# Patient Record
Sex: Female | Born: 2000 | Race: White | Hispanic: Yes | Smoking: Never smoker
Health system: Southern US, Community
[De-identification: ages and names within clinical notes are randomized; demographics above are authoritative.]

## PROBLEM LIST (undated history)

## (undated) HISTORY — PX: TYMPANOSTOMY TUBE PLACEMENT: SHX32

---
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
## 2019-09-08 ENCOUNTER — Other Ambulatory Visit: Payer: Self-pay

## 2019-09-08 DIAGNOSIS — Z20822 Contact with and (suspected) exposure to covid-19: Secondary | ICD-10-CM

## 2019-09-10 LAB — NOVEL CORONAVIRUS, NAA: SARS-CoV-2, NAA: NOT DETECTED

## 2019-09-15 ENCOUNTER — Other Ambulatory Visit: Payer: Self-pay

## 2019-09-15 DIAGNOSIS — Z20822 Contact with and (suspected) exposure to covid-19: Secondary | ICD-10-CM

## 2019-09-17 LAB — NOVEL CORONAVIRUS, NAA: SARS-CoV-2, NAA: DETECTED — AB

## 2020-01-08 ENCOUNTER — Other Ambulatory Visit: Payer: Self-pay

## 2020-01-08 ENCOUNTER — Emergency Department
Admission: EM | Admit: 2020-01-08 | Discharge: 2020-01-08 | Disposition: A | Discharge status: Home / Self Care | Payer: Self-pay | Attending: Emergency Medicine | Admitting: Emergency Medicine

## 2020-01-08 ENCOUNTER — Encounter: Payer: Self-pay | Admitting: Emergency Medicine

## 2020-01-08 DIAGNOSIS — J3489 Other specified disorders of nose and nasal sinuses: Secondary | ICD-10-CM | POA: Insufficient documentation

## 2020-01-08 DIAGNOSIS — Z5321 Procedure and treatment not carried out due to patient leaving prior to being seen by health care provider: Secondary | ICD-10-CM | POA: Insufficient documentation

## 2020-01-08 DIAGNOSIS — J029 Acute pharyngitis, unspecified: Secondary | ICD-10-CM | POA: Insufficient documentation

## 2020-01-08 DIAGNOSIS — R05 Cough: Secondary | ICD-10-CM | POA: Insufficient documentation

## 2020-01-08 NOTE — ED Triage Notes (Signed)
Pt reports cough, sore throat and runny nose for the past 2 days.

## 2020-01-10 ENCOUNTER — Other Ambulatory Visit: Payer: Self-pay | Admitting: Physician Assistant

## 2020-01-12 MED ORDER — NAPROXEN 500 MG PO TABS: 500.0000 mg | ORAL_TABLET | Freq: Two times a day (BID) | ORAL | Status: DC

## 2022-04-20 ENCOUNTER — Other Ambulatory Visit: Payer: Self-pay | Admitting: Primary Care

## 2022-04-20 DIAGNOSIS — N6310 Unspecified lump in the right breast, unspecified quadrant: Secondary | ICD-10-CM

## 2022-04-23 ENCOUNTER — Other Ambulatory Visit: Payer: Self-pay

## 2022-05-21 ENCOUNTER — Ambulatory Visit
Admission: RE | Admit: 2022-05-21 | Discharge: 2022-05-21 | Disposition: A | Discharge status: Home / Self Care | Payer: BC Managed Care – PPO | Source: Ambulatory Visit | Attending: Primary Care | Admitting: Primary Care

## 2022-05-21 DIAGNOSIS — N6310 Unspecified lump in the right breast, unspecified quadrant: Secondary | ICD-10-CM | POA: Insufficient documentation

## 2023-02-22 ENCOUNTER — Ambulatory Visit: Payer: BC Managed Care – PPO | Admitting: Family

## 2023-02-22 VITALS — BP 128/70 | HR 80 | Ht 62.0 in | Wt 237.2 lb

## 2023-02-22 DIAGNOSIS — R7303 Prediabetes: Secondary | ICD-10-CM | POA: Diagnosis not present

## 2023-02-22 DIAGNOSIS — E538 Deficiency of other specified B group vitamins: Secondary | ICD-10-CM | POA: Diagnosis not present

## 2023-02-22 DIAGNOSIS — I10 Essential (primary) hypertension: Secondary | ICD-10-CM | POA: Diagnosis not present

## 2023-02-22 DIAGNOSIS — E782 Mixed hyperlipidemia: Secondary | ICD-10-CM

## 2023-02-22 DIAGNOSIS — R5383 Other fatigue: Secondary | ICD-10-CM

## 2023-02-22 DIAGNOSIS — E66813 Obesity, class 3: Secondary | ICD-10-CM

## 2023-02-22 DIAGNOSIS — E559 Vitamin D deficiency, unspecified: Secondary | ICD-10-CM | POA: Diagnosis not present

## 2023-02-23 LAB — CMP14+EGFR
ALT: 29 IU/L (ref 0–32)
AST: 17 IU/L (ref 0–40)
Albumin/Globulin Ratio: 1.6 (ref 1.2–2.2)
Albumin: 4.1 g/dL (ref 4.0–5.0)
Alkaline Phosphatase: 75 IU/L (ref 44–121)
BUN/Creatinine Ratio: 16 (ref 9–23)
BUN: 10 mg/dL (ref 6–20)
Bilirubin Total: <0.2 mg/dL (ref 0.0–1.2)
CO2: 21 mmol/L (ref 20–29)
Calcium: 9.2 mg/dL (ref 8.7–10.2)
Chloride: 105 mmol/L (ref 96–106)
Creatinine, Ser: 0.63 mg/dL (ref 0.57–1.00)
Globulin, Total: 2.6 g/dL (ref 1.5–4.5)
Glucose: 82 mg/dL (ref 70–99)
Potassium: 4.2 mmol/L (ref 3.5–5.2)
Sodium: 140 mmol/L (ref 134–144)
Total Protein: 6.7 g/dL (ref 6.0–8.5)
eGFR: 129 mL/min/{1.73_m2} (ref >59)

## 2023-02-23 LAB — TSH: TSH: 0.013 u[IU]/mL — ABNORMAL LOW (ref 0.450–4.500)

## 2023-02-23 LAB — LIPID PANEL
Chol/HDL Ratio: 4.9 ratio — ABNORMAL HIGH (ref 0.0–4.4)
Cholesterol, Total: 161 mg/dL (ref 100–199)
HDL: 33 mg/dL — ABNORMAL LOW (ref >39)
LDL Chol Calc (NIH): 79 mg/dL (ref 0–99)
Triglycerides: 303 mg/dL — ABNORMAL HIGH (ref 0–149)
VLDL Cholesterol Cal: 49 mg/dL — ABNORMAL HIGH (ref 5–40)

## 2023-02-23 LAB — CBC WITH DIFFERENTIAL
Basophils Absolute: 0 10*3/uL (ref 0.0–0.2)
Basos: 0 %
EOS (ABSOLUTE): 0.1 10*3/uL (ref 0.0–0.4)
Eos: 1 %
Hematocrit: 38.9 % (ref 34.0–46.6)
Hemoglobin: 13.5 g/dL (ref 11.1–15.9)
Immature Grans (Abs): 0 10*3/uL (ref 0.0–0.1)
Immature Granulocytes: 0 %
Lymphocytes Absolute: 2.9 10*3/uL (ref 0.7–3.1)
Lymphs: 36 %
MCH: 30.3 pg (ref 26.6–33.0)
MCHC: 34.7 g/dL (ref 31.5–35.7)
MCV: 87 fL (ref 79–97)
Monocytes Absolute: 0.6 10*3/uL (ref 0.1–0.9)
Monocytes: 8 %
Neutrophils Absolute: 4.4 10*3/uL (ref 1.4–7.0)
Neutrophils: 55 %
RBC: 4.46 x10E6/uL (ref 3.77–5.28)
RDW: 11.9 % (ref 11.7–15.4)
WBC: 8 10*3/uL (ref 3.4–10.8)

## 2023-02-23 LAB — VITAMIN D 25 HYDROXY (VIT D DEFICIENCY, FRACTURES): Vit D, 25-Hydroxy: 23.4 ng/mL — ABNORMAL LOW (ref 30.0–100.0)

## 2023-02-23 LAB — HEMOGLOBIN A1C
Est. average glucose Bld gHb Est-mCnc: 111 mg/dL
Hgb A1c MFr Bld: 5.5 % (ref 4.8–5.6)

## 2023-02-23 LAB — VITAMIN B12: Vitamin B-12: 621 pg/mL (ref 232–1245)

## 2023-02-26 ENCOUNTER — Encounter: Payer: Self-pay | Admitting: Family

## 2023-02-26 DIAGNOSIS — R5383 Other fatigue: Secondary | ICD-10-CM | POA: Insufficient documentation

## 2023-02-26 DIAGNOSIS — E66813 Obesity, class 3: Secondary | ICD-10-CM

## 2023-02-26 DIAGNOSIS — R7303 Prediabetes: Secondary | ICD-10-CM

## 2023-02-26 HISTORY — DX: Morbid (severe) obesity due to excess calories: E66.01

## 2023-02-26 HISTORY — DX: Other fatigue: R53.83

## 2023-02-26 HISTORY — DX: Obesity, class 3: E66.813

## 2023-02-26 HISTORY — DX: Prediabetes: R73.03

## 2023-02-26 NOTE — Progress Notes (Signed)
New Patient Office Visit  Subjective    Patient ID: Julie Pierce, female    DOB: 02-02-2001  Age: 22 y.o. MRN: [id number]  CC:  Chief Complaint  Patient presents with   Establish Care    New Patient    HPI Terese Vi presents to establish care She does have additional concerns to discuss today.   She is concerned about her weight, and asks if we can restart her on the medication she was on previously.  She says that she had previously been given ozempic.   No other concerns today    Outpatient Encounter Medications as of 02/22/2023  Medication Sig   cetirizine (ZYRTEC) 10 MG tablet Take 10 mg by mouth daily.   naproxen (NAPROSYN) 500 MG tablet Take 1 tablet (500 mg total) by mouth 2 (two) times daily with a meal.   oxyCODONE-acetaminophen (ROXICET) 5-325 MG tablet Take 1 tablet by mouth every 6 (six) hours as needed.   No facility-administered encounter medications on file as of 02/22/2023.    No past medical history on file.  Past Surgical History:  Procedure Laterality Date   TYMPANOSTOMY TUBE PLACEMENT      No family history on file.  Social History   Socioeconomic History   Marital status: Significant Other    Spouse name: Not on file   Number of children: Not on file   Years of education: Not on file   Highest education level: Not on file  Occupational History   Not on file  Tobacco Use   Smoking status: Never   Smokeless tobacco: Not on file  Substance and Sexual Activity   Alcohol use: Not on file   Drug use: Not on file   Sexual activity: Not on file  Other Topics Concern   Not on file  Social History Narrative   Not on file   Social Determinants of Health   Financial Resource Strain: Not on file  Food Insecurity: Not on file  Transportation Needs: Not on file  Physical Activity: Not on file  Stress: Not on file  Social Connections: Not on file  Intimate Partner Violence: Not on file    Review of Systems  All other systems  reviewed and are negative.       Objective    BP 128/70   Pulse 80   Ht 5\' 2"  (1.575 m)   Wt 237 lb 3.2 oz (107.6 kg)   SpO2 97%   BMI 43.38 kg/m   Physical Exam Vitals and nursing note reviewed.  Constitutional:      Appearance: Normal appearance. She is obese.  HENT:     Head: Normocephalic and atraumatic.  Eyes:     Pupils: Pupils are equal, round, and reactive to light.  Cardiovascular:     Rate and Rhythm: Normal rate.  Pulmonary:     Effort: Pulmonary effort is normal.  Neurological:     General: No focal deficit present.     Mental Status: She is alert and oriented to person, place, and time. Mental status is at baseline.        Assessment & Plan:   Problem List Items Addressed This Visit   None Visit Diagnoses     B12 deficiency due to diet    -  Primary   Relevant Orders   CBC With Differential (Completed)   CMP14+EGFR (Completed)   Vitamin B12 (Completed)   Vitamin D deficiency, unspecified       Relevant Orders  VITAMIN D 25 Hydroxy (Vit-D Deficiency, Fractures) (Completed)   CBC With Differential (Completed)   CMP14+EGFR (Completed)   Prediabetes       Relevant Orders   CBC With Differential (Completed)   CMP14+EGFR (Completed)   Hemoglobin A1c (Completed)   Essential hypertension, benign       Relevant Orders   CBC With Differential (Completed)   CMP14+EGFR (Completed)   Mixed hyperlipidemia       Relevant Orders   Lipid panel (Completed)   CBC With Differential (Completed)   CMP14+EGFR (Completed)   Other fatigue       Relevant Orders   CBC With Differential (Completed)   CMP14+EGFR (Completed)   TSH (Completed)       Return in about 2 weeks (around 03/08/2023) for F/U.   Total time spent: 30 minutes  Miki Kins, FNP  02/22/2023

## 2023-03-08 ENCOUNTER — Ambulatory Visit: Payer: BC Managed Care – PPO | Admitting: Family

## 2023-03-08 ENCOUNTER — Encounter: Payer: Self-pay | Admitting: Family

## 2023-03-08 VITALS — BP 120/70 | HR 82 | Ht 62.0 in | Wt 239.4 lb

## 2023-03-08 DIAGNOSIS — R7303 Prediabetes: Secondary | ICD-10-CM

## 2023-03-08 DIAGNOSIS — R5383 Other fatigue: Secondary | ICD-10-CM | POA: Diagnosis not present

## 2023-03-08 DIAGNOSIS — R946 Abnormal results of thyroid function studies: Secondary | ICD-10-CM

## 2023-03-09 ENCOUNTER — Encounter: Payer: Self-pay | Admitting: Family

## 2023-03-09 LAB — TSH+T4F+T3FREE
Free T4: 0.6 ng/dL — ABNORMAL LOW (ref 0.82–1.77)
T3, Free: 2.2 pg/mL (ref 2.0–4.4)
TSH: 7.19 u[IU]/mL — ABNORMAL HIGH (ref 0.450–4.500)

## 2023-03-09 NOTE — Progress Notes (Signed)
Established Patient Office Visit  Subjective:  Patient ID: Julie Pierce, female    DOB: 04-07-2001  Age: 22 y.o. MRN: [id number]  Chief Complaint  Patient presents with   Follow-up    2 week follow up    Pt. Here today for 2 week n/p follow up.   Had labs done at that time, so we will review in detail today.  Labs: A1C elevated, Triglycerides high.  TSH was low, will recheck along with T3 and T4 today.   Restarting patient on Ozempic.  No other concerns at this time.   Past Medical History:  Diagnosis Date   Obesity, Class III, BMI 40-49.9 (morbid obesity) (HCC) 02/26/2023   Other fatigue 02/26/2023   Prediabetes 02/26/2023    Past Surgical History:  Procedure Laterality Date   TYMPANOSTOMY TUBE PLACEMENT  2008    Social History   Socioeconomic History   Marital status: Significant Other    Spouse name: Not on file   Number of children: Not on file   Years of education: Not on file   Highest education level: Not on file  Occupational History   Not on file  Tobacco Use   Smoking status: Never   Smokeless tobacco: Not on file  Vaping Use   Vaping Use: Some days  Substance and Sexual Activity   Alcohol use: Not Currently   Drug use: Never   Sexual activity: Yes    Birth control/protection: Implant  Other Topics Concern   Not on file  Social History Narrative   Not on file   Social Determinants of Health   Financial Resource Strain: Not on file  Food Insecurity: Not on file  Transportation Needs: Not on file  Physical Activity: Not on file  Stress: Not on file  Social Connections: Not on file  Intimate Partner Violence: Not on file    Family History  Problem Relation Age of Onset   Diabetes Mother    ADD / ADHD Father    Hypertension Father    Hyperlipidemia Father     No Known Allergies  Review of Systems  All other systems reviewed and are negative.      Objective:   BP 120/70   Pulse 82   Ht 5\' 2"  (1.575 m)   Wt 239 lb 6.4  oz (108.6 kg)   LMP 02/21/2023 (Exact Date)   SpO2 98%   BMI 43.79 kg/m   Vitals:   03/08/23 0921  BP: 120/70  Pulse: 82  Height: 5\' 2"  (1.575 m)  Weight: 239 lb 6.4 oz (108.6 kg)  SpO2: 98%  BMI (Calculated): 43.78    Physical Exam Vitals and nursing note reviewed.  Constitutional:      Appearance: Normal appearance. She is normal weight.  HENT:     Head: Normocephalic.  Eyes:     Extraocular Movements: Extraocular movements intact.     Conjunctiva/sclera: Conjunctivae normal.     Pupils: Pupils are equal, round, and reactive to light.  Cardiovascular:     Rate and Rhythm: Normal rate.  Pulmonary:     Effort: Pulmonary effort is normal.  Neurological:     General: No focal deficit present.     Mental Status: She is alert and oriented to person, place, and time.  Psychiatric:        Mood and Affect: Mood normal.        Behavior: Behavior normal.        Thought Content: Thought content normal.  Judgment: Judgment normal.      No results found for any visits on 03/08/23.  Recent Results (from the past 2160 hour(s))  Lipid panel     Status: Abnormal   Collection Time: 02/22/23 11:54 AM  Result Value Ref Range   Cholesterol, Total 161 100 - 199 mg/dL   Triglycerides 161 (H) 0 - 149 mg/dL   HDL 33 (L) >09 mg/dL   VLDL Cholesterol Cal 49 (H) 5 - 40 mg/dL   LDL Chol Calc (NIH) 79 0 - 99 mg/dL   Chol/HDL Ratio 4.9 (H) 0.0 - 4.4 ratio    Comment:                                   T. Chol/HDL Ratio                                             Men  Women                               1/2 Avg.Risk  3.4    3.3                                   Avg.Risk  5.0    4.4                                2X Avg.Risk  9.6    7.1                                3X Avg.Risk 23.4   11.0   VITAMIN D 25 Hydroxy (Vit-D Deficiency, Fractures)     Status: Abnormal   Collection Time: 02/22/23 11:54 AM  Result Value Ref Range   Vit D, 25-Hydroxy 23.4 (L) 30.0 - 100.0 ng/mL     Comment: Vitamin D deficiency has been defined by the Institute of Medicine and an Endocrine Society practice guideline as a level of serum 25-OH vitamin D less than 20 ng/mL (1,2). The Endocrine Society went on to further define vitamin D insufficiency as a level between 21 and 29 ng/mL (2). 1. IOM (Institute of Medicine). 2010. Dietary reference    intakes for calcium and D. [place]: The    [employer]. 2. Holick MF, Binkley Elliott, Bischoff-Ferrari HA, et al.    Evaluation, treatment, and prevention of vitamin D    deficiency: an Endocrine Society clinical practice    guideline. JCEM. 2011 Jul; 96(7):1911-30.   CBC With Differential     Status: None   Collection Time: 02/22/23 11:54 AM  Result Value Ref Range   WBC 8.0 3.4 - 10.8 x10E3/uL   RBC 4.46 3.77 - 5.28 x10E6/uL   Hemoglobin 13.5 11.1 - 15.9 g/dL   Hematocrit 60.4 54.0 - 46.6 %   MCV 87 79 - 97 fL   MCH 30.3 26.6 - 33.0 pg   MCHC 34.7 31.5 - 35.7 g/dL   RDW 98.1 19.1 - 47.8 %   Neutrophils 55 Not Estab. %   Lymphs 36 Not Estab. %  Monocytes 8 Not Estab. %   Eos 1 Not Estab. %   Basos 0 Not Estab. %   Neutrophils Absolute 4.4 1.4 - 7.0 x10E3/uL   Lymphocytes Absolute 2.9 0.7 - 3.1 x10E3/uL   Monocytes Absolute 0.6 0.1 - 0.9 x10E3/uL   EOS (ABSOLUTE) 0.1 0.0 - 0.4 x10E3/uL   Basophils Absolute 0.0 0.0 - 0.2 x10E3/uL   Immature Granulocytes 0 Not Estab. %   Immature Grans (Abs) 0.0 0.0 - 0.1 x10E3/uL  CMP14+EGFR     Status: None   Collection Time: 02/22/23 11:54 AM  Result Value Ref Range   Glucose 82 70 - 99 mg/dL   BUN 10 6 - 20 mg/dL   Creatinine, Ser 1.61 0.57 - 1.00 mg/dL   eGFR 096 >04 VW/UJW/1.19   BUN/Creatinine Ratio 16 9 - 23   Sodium 140 134 - 144 mmol/L   Potassium 4.2 3.5 - 5.2 mmol/L   Chloride 105 96 - 106 mmol/L   CO2 21 20 - 29 mmol/L   Calcium 9.2 8.7 - 10.2 mg/dL   Total Protein 6.7 6.0 - 8.5 g/dL   Albumin 4.1 4.0 - 5.0 g/dL   Globulin, Total 2.6 1.5 - 4.5 g/dL    Albumin/Globulin Ratio 1.6 1.2 - 2.2   Bilirubin Total <0.2 0.0 - 1.2 mg/dL   Alkaline Phosphatase 75 44 - 121 IU/L   AST 17 0 - 40 IU/L   ALT 29 0 - 32 IU/L  TSH     Status: Abnormal   Collection Time: 02/22/23 11:54 AM  Result Value Ref Range   TSH 0.013 (L) 0.450 - 4.500 uIU/mL  Hemoglobin A1c     Status: None   Collection Time: 02/22/23 11:54 AM  Result Value Ref Range   Hgb A1c MFr Bld 5.5 4.8 - 5.6 %    Comment:          Prediabetes: 5.7 - 6.4          Diabetes: >6.4          Glycemic control for adults with diabetes: <7.0    Est. average glucose Bld gHb Est-mCnc 111 mg/dL  Vitamin J47     Status: None   Collection Time: 02/22/23 11:54 AM  Result Value Ref Range   Vitamin B-12 621 232 - 1,245 pg/mL       Assessment & Plan:   Problem List Items Addressed This Visit       Active Problems   Obesity, Class III, BMI 40-49.9 (morbid obesity) (HCC)   Other fatigue   Prediabetes - Primary   Other Visit Diagnoses     Abnormal thyroid function test       Relevant Orders   TSH+T4F+T3Free       Return in about 1 month (around 04/08/2023).   Total time spent: 30 minutes  Miki Kins, FNP  03/08/2023   This document may have been prepared by [facility] Voice Recognition software and as such may include unintentional dictation errors.

## 2023-03-10 ENCOUNTER — Encounter: Payer: Self-pay | Admitting: Family

## 2023-03-19 MED ORDER — VITAMIN D (ERGOCALCIFEROL) 1.25 MG (50000 UNIT) PO CAPS: 50000.0000 [IU] | ORAL_CAPSULE | ORAL | 1 refills | Status: DC

## 2023-03-19 NOTE — Addendum Note (Signed)
Addended by: Grayling Congress on: 03/19/2023 10:44 AM   Modules accepted: Orders

## 2023-04-12 ENCOUNTER — Ambulatory Visit: Payer: BC Managed Care – PPO | Admitting: Family

## 2023-04-12 ENCOUNTER — Encounter: Payer: Self-pay | Admitting: Family

## 2023-04-12 VITALS — BP 125/69 | HR 81 | Ht 62.0 in | Wt 237.6 lb

## 2023-04-12 DIAGNOSIS — R946 Abnormal results of thyroid function studies: Secondary | ICD-10-CM

## 2023-04-12 DIAGNOSIS — R7303 Prediabetes: Secondary | ICD-10-CM | POA: Diagnosis not present

## 2023-04-13 LAB — TSH+T4F+T3FREE
Free T4: 0.87 ng/dL (ref 0.82–1.77)
T3, Free: 3.5 pg/mL (ref 2.0–4.4)
TSH: 3.24 u[IU]/mL (ref 0.450–4.500)

## 2023-04-14 ENCOUNTER — Encounter: Payer: Self-pay | Admitting: Family

## 2023-04-14 NOTE — Progress Notes (Signed)
Established Patient Office Visit  Subjective:  Patient ID: Julie Pierce, female    DOB: Sep 14, 2001  Age: 22 y.o. MRN: [id number]  Chief Complaint  Patient presents with   Follow-up    1 mo F/U    Patient is here today for her 1 month follow up.  She has been feeling well since last appointment.   She does not have additional concerns to discuss today.  She has not had any issues since restarting the Ozempic.  She does need to have her TSH rechecked today.  She needs refills.   I have reviewed her active problem list, medication list, allergies, notes from last encounter, lab results for her appointment today.   No other concerns at this time.   Past Medical History:  Diagnosis Date   Obesity, Class III, BMI 40-49.9 (morbid obesity) (HCC) 02/26/2023   Other fatigue 02/26/2023   Prediabetes 02/26/2023    Past Surgical History:  Procedure Laterality Date   TYMPANOSTOMY TUBE PLACEMENT  2008    Social History   Socioeconomic History   Marital status: Significant Other    Spouse name: Not on file   Number of children: Not on file   Years of education: Not on file   Highest education level: Not on file  Occupational History   Not on file  Tobacco Use   Smoking status: Never   Smokeless tobacco: Not on file  Vaping Use   Vaping Use: Some days  Substance and Sexual Activity   Alcohol use: Not Currently   Drug use: Never   Sexual activity: Yes    Birth control/protection: Implant  Other Topics Concern   Not on file  Social History Narrative   Not on file   Social Determinants of Health   Financial Resource Strain: Not on file  Food Insecurity: Not on file  Transportation Needs: Not on file  Physical Activity: Not on file  Stress: Not on file  Social Connections: Not on file  Intimate Partner Violence: Not on file    Family History  Problem Relation Age of Onset   Diabetes Mother    ADD / ADHD Father    Hypertension Father    Hyperlipidemia Father      No Known Allergies  Review of Systems  All other systems reviewed and are negative.      Objective:   BP 125/69   Pulse 81   Ht 5\' 2"  (1.575 m)   Wt 237 lb 9.6 oz (107.8 kg)   SpO2 99%   BMI 43.46 kg/m   Vitals:   04/12/23 1016  BP: 125/69  Pulse: 81  Height: 5\' 2"  (1.575 m)  Weight: 237 lb 9.6 oz (107.8 kg)  SpO2: 99%  BMI (Calculated): 43.45    Physical Exam Vitals and nursing note reviewed.  Constitutional:      Appearance: Normal appearance. She is normal weight.  HENT:     Head: Normocephalic.  Eyes:     Pupils: Pupils are equal, round, and reactive to light.  Cardiovascular:     Rate and Rhythm: Normal rate.  Pulmonary:     Effort: Pulmonary effort is normal.  Neurological:     Mental Status: She is alert.      Results for orders placed or performed in visit on 04/12/23  TSH+T4F+T3Free  Result Value Ref Range   TSH 3.240 0.450 - 4.500 uIU/mL   T3, Free 3.5 2.0 - 4.4 pg/mL   Free T4 0.87 0.82 - 1.77 ng/dL  Recent Results (from the past 2160 hour(s))  Lipid panel     Status: Abnormal   Collection Time: 02/22/23 11:54 AM  Result Value Ref Range   Cholesterol, Total 161 100 - 199 mg/dL   Triglycerides 161 (H) 0 - 149 mg/dL   HDL 33 (L) >09 mg/dL   VLDL Cholesterol Cal 49 (H) 5 - 40 mg/dL   LDL Chol Calc (NIH) 79 0 - 99 mg/dL   Chol/HDL Ratio 4.9 (H) 0.0 - 4.4 ratio    Comment:                                   T. Chol/HDL Ratio                                             Men  Women                               1/2 Avg.Risk  3.4    3.3                                   Avg.Risk  5.0    4.4                                2X Avg.Risk  9.6    7.1                                3X Avg.Risk 23.4   11.0   VITAMIN D 25 Hydroxy (Vit-D Deficiency, Fractures)     Status: Abnormal   Collection Time: 02/22/23 11:54 AM  Result Value Ref Range   Vit D, 25-Hydroxy 23.4 (L) 30.0 - 100.0 ng/mL    Comment: Vitamin D deficiency has been defined by  the Institute of Medicine and an Endocrine Society practice guideline as a level of serum 25-OH vitamin D less than 20 ng/mL (1,2). The Endocrine Society went on to further define vitamin D insufficiency as a level between 21 and 29 ng/mL (2). 1. IOM (Institute of Medicine). 2010. Dietary reference    intakes for calcium and D. [place]: The    [employer]. 2. Holick MF, Binkley [place], Bischoff-Ferrari HA, et al.    Evaluation, treatment, and prevention of vitamin D    deficiency: an Endocrine Society clinical practice    guideline. JCEM. 2011 Jul; 96(7):1911-30.   CBC With Differential     Status: None   Collection Time: 02/22/23 11:54 AM  Result Value Ref Range   WBC 8.0 3.4 - 10.8 x10E3/uL   RBC 4.46 3.77 - 5.28 x10E6/uL   Hemoglobin 13.5 11.1 - 15.9 g/dL   Hematocrit 60.4 54.0 - 46.6 %   MCV 87 79 - 97 fL   MCH 30.3 26.6 - 33.0 pg   MCHC 34.7 31.5 - 35.7 g/dL   RDW 98.1 19.1 - 47.8 %   Neutrophils 55 Not Estab. %   Lymphs 36 Not Estab. %   Monocytes 8 Not Estab. %   Eos 1 Not Estab. %   Basos  0 Not Estab. %   Neutrophils Absolute 4.4 1.4 - 7.0 x10E3/uL   Lymphocytes Absolute 2.9 0.7 - 3.1 x10E3/uL   Monocytes Absolute 0.6 0.1 - 0.9 x10E3/uL   EOS (ABSOLUTE) 0.1 0.0 - 0.4 x10E3/uL   Basophils Absolute 0.0 0.0 - 0.2 x10E3/uL   Immature Granulocytes 0 Not Estab. %   Immature Grans (Abs) 0.0 0.0 - 0.1 x10E3/uL  CMP14+EGFR     Status: None   Collection Time: 02/22/23 11:54 AM  Result Value Ref Range   Glucose 82 70 - 99 mg/dL   BUN 10 6 - 20 mg/dL   Creatinine, Ser 1.61 0.57 - 1.00 mg/dL   eGFR 096 >04 VW/UJW/1.19   BUN/Creatinine Ratio 16 9 - 23   Sodium 140 134 - 144 mmol/L   Potassium 4.2 3.5 - 5.2 mmol/L   Chloride 105 96 - 106 mmol/L   CO2 21 20 - 29 mmol/L   Calcium 9.2 8.7 - 10.2 mg/dL   Total Protein 6.7 6.0 - 8.5 g/dL   Albumin 4.1 4.0 - 5.0 g/dL   Globulin, Total 2.6 1.5 - 4.5 g/dL   Albumin/Globulin Ratio 1.6 1.2 - 2.2   Bilirubin Total  <0.2 0.0 - 1.2 mg/dL   Alkaline Phosphatase 75 44 - 121 IU/L   AST 17 0 - 40 IU/L   ALT 29 0 - 32 IU/L  TSH     Status: Abnormal   Collection Time: 02/22/23 11:54 AM  Result Value Ref Range   TSH 0.013 (L) 0.450 - 4.500 uIU/mL  Hemoglobin A1c     Status: None   Collection Time: 02/22/23 11:54 AM  Result Value Ref Range   Hgb A1c MFr Bld 5.5 4.8 - 5.6 %    Comment:          Prediabetes: 5.7 - 6.4          Diabetes: >6.4          Glycemic control for adults with diabetes: <7.0    Est. average glucose Bld gHb Est-mCnc 111 mg/dL  Vitamin J47     Status: None   Collection Time: 02/22/23 11:54 AM  Result Value Ref Range   Vitamin B-12 621 232 - 1,245 pg/mL  TSH+T4F+T3Free     Status: Abnormal   Collection Time: 03/08/23 10:25 AM  Result Value Ref Range   TSH 7.190 (H) 0.450 - 4.500 uIU/mL   T3, Free 2.2 2.0 - 4.4 pg/mL   Free T4 0.60 (L) 0.82 - 1.77 ng/dL  [id number]+[id number]     Status: None   Collection Time: 04/12/23 10:33 AM  Result Value Ref Range   TSH 3.240 0.450 - 4.500 uIU/mL   T3, Free 3.5 2.0 - 4.4 pg/mL   Free T4 0.87 0.82 - 1.77 ng/dL       Assessment & Plan:   Problem List Items Addressed This Visit       Active Problems   Obesity, Class III, BMI 40-49.9 (morbid obesity) (HCC)    Continue current meds.  Will adjust as needed based on results.  The patient is asked to make an attempt to improve diet and exercise patterns to aid in medical management of this problem. Addressed importance of increasing and maintaining water intake.        Prediabetes    A1C Continues to be in prediabetic ranges.  Will reassess at follow up after next lab check.  Patient counseled on dietary choices and verbalized understanding.  Patient educated on foods that contain carbohydrates  and the need to decrease intake.  We discussed prediabetes, and what it means and the need for strict dietary control to prevent progression to type 2 diabetes.  Advised to decrease intake of sugary  drinks, including sodas, sweet tea, and some juices, and of starch and sugar heavy foods (ie., potatoes, rice, bread, pasta, desserts). She verbalizes understanding and agreement with the changes discussed today.        Other Visit Diagnoses     Abnormal results of thyroid function studies    -  Primary   Rechecking TSH/T3/T4 today.  will adjust based on results.   Relevant Orders   TSH+T4F+T3Free (Completed)       Return in about 1 month (around 05/12/2023).   Total time spent: 20 minutes  Miki Kins, FNP  04/12/2023   This document may have been prepared by [facility] Voice Recognition software and as such may include unintentional dictation errors.

## 2023-04-14 NOTE — Assessment & Plan Note (Signed)
A1C Continues to be in prediabetic ranges.  Will reassess at follow up after next lab check.  Patient counseled on dietary choices and verbalized understanding.  Patient educated on foods that contain carbohydrates and the need to decrease intake.  We discussed prediabetes, and what it means and the need for strict dietary control to prevent progression to type 2 diabetes.  Advised to decrease intake of sugary drinks, including sodas, sweet tea, and some juices, and of starch and sugar heavy foods (ie., potatoes, rice, bread, pasta, desserts). She verbalizes understanding and agreement with the changes discussed today.  

## 2023-04-14 NOTE — Assessment & Plan Note (Signed)
Continue current meds.  Will adjust as needed based on results.  The patient is asked to make an attempt to improve diet and exercise patterns to aid in medical management of this problem. Addressed importance of increasing and maintaining water intake.   

## 2023-05-14 ENCOUNTER — Ambulatory Visit: Payer: BC Managed Care – PPO | Admitting: Family

## 2023-05-24 ENCOUNTER — Ambulatory Visit: Payer: BC Managed Care – PPO | Admitting: Family

## 2023-05-24 ENCOUNTER — Encounter: Payer: Self-pay | Admitting: Family

## 2023-05-24 VITALS — BP 130/89 | HR 77 | Ht 62.0 in | Wt 236.4 lb

## 2023-05-24 DIAGNOSIS — I10 Essential (primary) hypertension: Secondary | ICD-10-CM

## 2023-05-24 DIAGNOSIS — E782 Mixed hyperlipidemia: Secondary | ICD-10-CM | POA: Diagnosis not present

## 2023-05-24 DIAGNOSIS — E559 Vitamin D deficiency, unspecified: Secondary | ICD-10-CM

## 2023-05-24 DIAGNOSIS — R7303 Prediabetes: Secondary | ICD-10-CM

## 2023-05-24 DIAGNOSIS — G4452 New daily persistent headache (NDPH): Secondary | ICD-10-CM

## 2023-05-24 DIAGNOSIS — E538 Deficiency of other specified B group vitamins: Secondary | ICD-10-CM

## 2023-05-24 DIAGNOSIS — R5383 Other fatigue: Secondary | ICD-10-CM

## 2023-05-27 ENCOUNTER — Other Ambulatory Visit: Payer: Self-pay

## 2023-05-27 MED ORDER — FENOFIBRATE 145 MG PO TABS: 145.0000 mg | ORAL_TABLET | Freq: Every day | ORAL | 11 refills | Status: DC

## 2023-06-16 NOTE — Assessment & Plan Note (Signed)

## 2023-06-16 NOTE — Progress Notes (Signed)
Established Patient Office Visit  Subjective:  Patient ID: Julie Pierce, female    DOB: 01/17/2001  Age: 22 y.o. MRN: [id number]  Chief Complaint  Patient presents with   Follow-up    Rescheduled appt    Patient is here today for her 3 months follow up.  She has been feeling well since last appointment.   She does have additional concerns to discuss today.  She has been having difficulty with headaches and daytime sleepiness. She says these have been happening for a while. She has been told in the past that she snores.   Labs are due today. She needs refills.   I have reviewed her active problem list, medication list, allergies, notes from last encounter, lab results for her appointment today.   No other concerns at this time.   Past Medical History:  Diagnosis Date   Obesity, Class III, BMI 40-49.9 (morbid obesity) (HCC) 02/26/2023   Other fatigue 02/26/2023   Prediabetes 02/26/2023    Past Surgical History:  Procedure Laterality Date   TYMPANOSTOMY TUBE PLACEMENT  2008    Social History   Socioeconomic History   Marital status: Significant Other    Spouse name: Not on file   Number of children: Not on file   Years of education: Not on file   Highest education level: Not on file  Occupational History   Not on file  Tobacco Use   Smoking status: Never   Smokeless tobacco: Not on file  Vaping Use   Vaping status: Some Days  Substance and Sexual Activity   Alcohol use: Not Currently   Drug use: Never   Sexual activity: Yes    Birth control/protection: Implant  Other Topics Concern   Not on file  Social History Narrative   Not on file   Social Determinants of Health   Financial Resource Strain: Not on file  Food Insecurity: Not on file  Transportation Needs: Not on file  Physical Activity: Not on file  Stress: Not on file  Social Connections: Not on file  Intimate Partner Violence: Not on file    Family History  Problem Relation Age of Onset    Diabetes Mother    ADD / ADHD Father    Hypertension Father    Hyperlipidemia Father     No Known Allergies  Review of Systems  Constitutional:  Positive for malaise/fatigue.  All other systems reviewed and are negative.      Objective:   BP 130/89   Pulse 77   Ht 5\' 2"  (1.575 m)   Wt 236 lb 6.4 oz (107.2 kg)   SpO2 98%   BMI 43.24 kg/m   Vitals:   05/24/23 1311  BP: 130/89  Pulse: 77  Height: 5\' 2"  (1.575 m)  Weight: 236 lb 6.4 oz (107.2 kg)  SpO2: 98%  BMI (Calculated): 43.23    Physical Exam Vitals and nursing note reviewed.  Constitutional:      Appearance: Normal appearance. She is normal weight.  HENT:     Head: Normocephalic.  Eyes:     Pupils: Pupils are equal, round, and reactive to light.  Cardiovascular:     Rate and Rhythm: Normal rate.  Pulmonary:     Effort: Pulmonary effort is normal.  Neurological:     General: No focal deficit present.     Mental Status: She is alert and oriented to person, place, and time. Mental status is at baseline.  Psychiatric:  Mood and Affect: Mood normal.        Thought Content: Thought content normal.        Judgment: Judgment normal.      Results for orders placed or performed in visit on 05/24/23  Lipid panel  Result Value Ref Range   Cholesterol, Total 165 100 - 199 mg/dL   Triglycerides 528 (H) 0 - 149 mg/dL   HDL 33 (L) >41 mg/dL   VLDL Cholesterol Cal 51 (H) 5 - 40 mg/dL   LDL Chol Calc (NIH) 81 0 - 99 mg/dL   Chol/HDL Ratio 5.0 (H) 0.0 - 4.4 ratio  VITAMIN D 25 Hydroxy (Vit-D Deficiency, Fractures)  Result Value Ref Range   Vit D, 25-Hydroxy 35.7 30.0 - 100.0 ng/mL  CMP14+EGFR  Result Value Ref Range   Glucose 76 70 - 99 mg/dL   BUN 11 6 - 20 mg/dL   Creatinine, Ser 3.24 0.57 - 1.00 mg/dL   eGFR 401 >02 VO/ZDG/6.44   BUN/Creatinine Ratio 17 9 - 23   Sodium 139 134 - 144 mmol/L   Potassium 4.6 3.5 - 5.2 mmol/L   Chloride 105 96 - 106 mmol/L   CO2 19 (L) 20 - 29 mmol/L   Calcium  9.2 8.7 - 10.2 mg/dL   Total Protein 6.8 6.0 - 8.5 g/dL   Albumin 4.0 4.0 - 5.0 g/dL   Globulin, Total 2.8 1.5 - 4.5 g/dL   Bilirubin Total <0.3 0.0 - 1.2 mg/dL   Alkaline Phosphatase 75 44 - 121 IU/L   AST 15 0 - 40 IU/L   ALT 34 (H) 0 - 32 IU/L  TSH  Result Value Ref Range   TSH 1.690 0.450 - 4.500 uIU/mL  Hemoglobin A1c  Result Value Ref Range   Hgb A1c MFr Bld 5.3 4.8 - 5.6 %   Est. average glucose Bld gHb Est-mCnc 105 mg/dL  Vitamin K74  Result Value Ref Range   Vitamin B-12 569 232 - 1,245 pg/mL    Recent Results (from the past 2160 hour(s))  TSH+T4F+T3Free     Status: None   Collection Time: 04/12/23 10:33 AM  Result Value Ref Range   TSH 3.240 0.450 - 4.500 uIU/mL   T3, Free 3.5 2.0 - 4.4 pg/mL   Free T4 0.87 0.82 - 1.77 ng/dL  Lipid panel     Status: Abnormal   Collection Time: 05/24/23  1:44 PM  Result Value Ref Range   Cholesterol, Total 165 100 - 199 mg/dL   Triglycerides 259 (H) 0 - 149 mg/dL   HDL 33 (L) >56 mg/dL   VLDL Cholesterol Cal 51 (H) 5 - 40 mg/dL   LDL Chol Calc (NIH) 81 0 - 99 mg/dL   Chol/HDL Ratio 5.0 (H) 0.0 - 4.4 ratio    Comment:                                   T. Chol/HDL Ratio                                             Men  Women                               1/2 Avg.Risk  3.4  3.3                                   Avg.Risk  5.0    4.4                                2X Avg.Risk  9.6    7.1                                3X Avg.Risk 23.4   11.0   VITAMIN D 25 Hydroxy (Vit-D Deficiency, Fractures)     Status: None   Collection Time: 05/24/23  1:44 PM  Result Value Ref Range   Vit D, 25-Hydroxy 35.7 30.0 - 100.0 ng/mL    Comment: Vitamin D deficiency has been defined by the Institute of Medicine and an Endocrine Society practice guideline as a level of serum 25-OH vitamin D less than 20 ng/mL (1,2). The Endocrine Society went on to further define vitamin D insufficiency as a level between 21 and 29 ng/mL (2). 1. IOM (Institute of  Medicine). 2010. Dietary reference    intakes for calcium and D. [place]: The    [employer]. 2. Holick MF, Binkley [place], Bischoff-Ferrari HA, et al.    Evaluation, treatment, and prevention of vitamin D    deficiency: an Endocrine Society clinical practice    guideline. JCEM. 2011 Jul; 96(7):1911-30.   CMP14+EGFR     Status: Abnormal   Collection Time: 05/24/23  1:44 PM  Result Value Ref Range   Glucose 76 70 - 99 mg/dL   BUN 11 6 - 20 mg/dL   Creatinine, Ser 1.61 0.57 - 1.00 mg/dL   eGFR 096 >04 VW/UJW/1.19   BUN/Creatinine Ratio 17 9 - 23   Sodium 139 134 - 144 mmol/L   Potassium 4.6 3.5 - 5.2 mmol/L   Chloride 105 96 - 106 mmol/L   CO2 19 (L) 20 - 29 mmol/L   Calcium 9.2 8.7 - 10.2 mg/dL   Total Protein 6.8 6.0 - 8.5 g/dL   Albumin 4.0 4.0 - 5.0 g/dL   Globulin, Total 2.8 1.5 - 4.5 g/dL   Bilirubin Total <1.4 0.0 - 1.2 mg/dL   Alkaline Phosphatase 75 44 - 121 IU/L   AST 15 0 - 40 IU/L   ALT 34 (H) 0 - 32 IU/L  TSH     Status: None   Collection Time: 05/24/23  1:44 PM  Result Value Ref Range   TSH 1.690 0.450 - 4.500 uIU/mL  Hemoglobin A1c     Status: None   Collection Time: 05/24/23  1:44 PM  Result Value Ref Range   Hgb A1c MFr Bld 5.3 4.8 - 5.6 %    Comment:          Prediabetes: 5.7 - 6.4          Diabetes: >6.4          Glycemic control for adults with diabetes: <7.0    Est. average glucose Bld gHb Est-mCnc 105 mg/dL  Vitamin N82     Status: None   Collection Time: 05/24/23  1:44 PM  Result Value Ref Range   Vitamin B-12 569 232 - 1,245 pg/mL       Assessment & Plan:   Problem List Items Addressed This Visit  Active Problems   Obesity, Class III, BMI 40-49.9 (morbid obesity) (HCC)    Continue current meds.  Will adjust as needed based on results.  The patient is asked to make an attempt to improve diet and exercise patterns to aid in medical management of this problem. Addressed importance of increasing and maintaining water  intake.        Relevant Orders   CMP14+EGFR (Completed)   Other fatigue   Relevant Orders   CMP14+EGFR (Completed)   TSH (Completed)   Prediabetes - Primary    Patient educated on foods that contain carbohydrates and the need to decrease intake.  We discussed prediabetes, and what it means and the need for strict dietary control to prevent progression to type 2 diabetes.  Advised to decrease intake of sugary drinks, including sodas, sweet tea, and some juices, and of starch and sugar heavy foods (ie., potatoes, rice, bread, pasta, desserts). She verbalizes understanding and agreement with the changes discussed today.  A1C Continues to be in prediabetic ranges.  Will reassess at follow up after next lab check.  Patient counseled on dietary choices and verbalized understanding.        Relevant Orders   CMP14+EGFR (Completed)   Hemoglobin A1c (Completed)   Other Visit Diagnoses     B12 deficiency due to diet       Checking labs today.  Will continue supplements as needed.   Relevant Orders   CMP14+EGFR (Completed)   Vitamin B12 (Completed)   Vitamin D deficiency, unspecified       Checking labs today.  Will continue supplements as needed.   Relevant Orders   VITAMIN D 25 Hydroxy (Vit-D Deficiency, Fractures) (Completed)   CMP14+EGFR (Completed)   Mixed hyperlipidemia       Checking labs today.  Continue current therapy for lipid control. Will modify as needed based on labwork results.   Relevant Orders   Lipid panel (Completed)   CMP14+EGFR (Completed)   Essential hypertension, benign       Blood pressure well controlled with current medications.  Continue current therapy.  Will reassess at follow up.   Relevant Orders   CMP14+EGFR (Completed)   New daily persistent headache       Ordering sleep study for pt.  Will contact her once we get these results back.   Relevant Orders   Ambulatory referral to Sleep Studies       Return in about 3 months (around  08/24/2023).   Total time spent: 30 minutes  Miki Kins, FNP  05/24/2023   This document may have been prepared by [facility] Voice Recognition software and as such may include unintentional dictation errors.

## 2023-06-16 NOTE — Assessment & Plan Note (Signed)
Continue current meds.  Will adjust as needed based on results.  The patient is asked to make an attempt to improve diet and exercise patterns to aid in medical management of this problem. Addressed importance of increasing and maintaining water intake.   

## 2023-08-29 ENCOUNTER — Ambulatory Visit: Payer: BC Managed Care – PPO | Admitting: Family

## 2023-09-25 ENCOUNTER — Ambulatory Visit: Payer: BC Managed Care – PPO | Admitting: Family

## 2023-10-23 ENCOUNTER — Encounter: Payer: Self-pay | Admitting: Family

## 2023-10-23 ENCOUNTER — Ambulatory Visit (INDEPENDENT_AMBULATORY_CARE_PROVIDER_SITE_OTHER): Payer: BC Managed Care – PPO | Admitting: Family

## 2023-10-23 VITALS — BP 118/84 | HR 75 | Ht 62.0 in | Wt 236.8 lb

## 2023-10-23 DIAGNOSIS — E538 Deficiency of other specified B group vitamins: Secondary | ICD-10-CM | POA: Diagnosis not present

## 2023-10-23 DIAGNOSIS — E559 Vitamin D deficiency, unspecified: Secondary | ICD-10-CM

## 2023-10-23 DIAGNOSIS — I10 Essential (primary) hypertension: Secondary | ICD-10-CM

## 2023-10-23 DIAGNOSIS — R7303 Prediabetes: Secondary | ICD-10-CM

## 2023-10-23 DIAGNOSIS — R5383 Other fatigue: Secondary | ICD-10-CM

## 2023-10-23 DIAGNOSIS — Z3009 Encounter for other general counseling and advice on contraception: Secondary | ICD-10-CM

## 2023-10-23 DIAGNOSIS — E782 Mixed hyperlipidemia: Secondary | ICD-10-CM

## 2023-10-23 MED ORDER — ANNOVERA 0.013-0.15 MG/24HR VA RING: VAGINAL_RING | VAGINAL | 0 refills | Status: DC

## 2023-10-23 NOTE — Progress Notes (Signed)
 Established Patient Office Visit  Subjective:  Patient ID: Julie Pierce, female    DOB: 03-05-2001  Age: 23 y.o. MRN: [id number]  Chief Complaint  Patient presents with   Follow-up    Needs birth control    Patient is here today for her  follow up.  She has been feeling well since last appointment.   She does have additional concerns to discuss today.  Asks about options for birth control. Is almost at the end of her nexplanon, asks to decide which method she wants to use going forward.   Labs are due today. She needs refills.   I have reviewed her active problem list, medication list, allergies, health maintenance, notes from last encounter, lab results for her appointment today.      No other concerns at this time.   Past Medical History:  Diagnosis Date   Obesity, Class III, BMI 40-49.9 (morbid obesity) (HCC) 02/26/2023   Other fatigue 02/26/2023   Prediabetes 02/26/2023    Past Surgical History:  Procedure Laterality Date   TYMPANOSTOMY TUBE PLACEMENT  2008    Social History   Socioeconomic History   Marital status: Significant Other    Spouse name: Not on file   Number of children: Not on file   Years of education: Not on file   Highest education level: Not on file  Occupational History   Not on file  Tobacco Use   Smoking status: Never   Smokeless tobacco: Not on file  Vaping Use   Vaping status: Some Days  Substance and Sexual Activity   Alcohol use: Not Currently   Drug use: Never   Sexual activity: Yes    Birth control/protection: Implant  Other Topics Concern   Not on file  Social History Narrative   Not on file   Social Drivers of Health   Financial Resource Strain: Not on file  Food Insecurity: Not on file  Transportation Needs: Not on file  Physical Activity: Not on file  Stress: Not on file  Social Connections: Not on file  Intimate Partner Violence: Not on file    Family History  Problem Relation Age of Onset   Diabetes  Mother    ADD / ADHD Father    Hypertension Father    Hyperlipidemia Father     No Known Allergies  Review of Systems  All other systems reviewed and are negative.      Objective:   BP 118/84   Pulse 75   Ht 5' 2 (1.575 m)   Wt 236 lb 12.8 oz (107.4 kg)   SpO2 99%   BMI 43.31 kg/m   Vitals:   10/23/23 0936  BP: 118/84  Pulse: 75  Height: 5' 2 (1.575 m)  Weight: 236 lb 12.8 oz (107.4 kg)  SpO2: 99%  BMI (Calculated): 43.3    Physical Exam Vitals and nursing note reviewed.  Constitutional:      Appearance: Normal appearance. She is obese.  HENT:     Head: Normocephalic.  Eyes:     Extraocular Movements: Extraocular movements intact.     Conjunctiva/sclera: Conjunctivae normal.     Pupils: Pupils are equal, round, and reactive to light.  Cardiovascular:     Rate and Rhythm: Normal rate.  Pulmonary:     Effort: Pulmonary effort is normal.  Musculoskeletal:        General: Normal range of motion.  Neurological:     General: No focal deficit present.     Mental Status:  She is alert and oriented to person, place, and time. Mental status is at baseline.  Psychiatric:        Mood and Affect: Mood normal.        Behavior: Behavior normal.        Thought Content: Thought content normal.      Results for orders placed or performed in visit on 10/23/23  Lipid panel  Result Value Ref Range   Cholesterol, Total 137 100 - 199 mg/dL   Triglycerides 801 (H) 0 - 149 mg/dL   HDL 32 (L) >60 mg/dL   VLDL Cholesterol Cal 33 5 - 40 mg/dL   LDL Chol Calc (NIH) 72 0 - 99 mg/dL   Chol/HDL Ratio 4.3 0.0 - 4.4 ratio  VITAMIN D  25 Hydroxy (Vit-D Deficiency, Fractures)  Result Value Ref Range   Vit D, 25-Hydroxy 42.9 30.0 - 100.0 ng/mL  CMP14+EGFR  Result Value Ref Range   Glucose 74 70 - 99 mg/dL   BUN 12 6 - 20 mg/dL   Creatinine, Ser 9.30 0.57 - 1.00 mg/dL   eGFR 874 >40 fO/fpw/8.26   BUN/Creatinine Ratio 17 9 - 23   Sodium 141 134 - 144 mmol/L   Potassium 4.2  3.5 - 5.2 mmol/L   Chloride 103 96 - 106 mmol/L   CO2 24 20 - 29 mmol/L   Calcium 9.3 8.7 - 10.2 mg/dL   Total Protein 6.6 6.0 - 8.5 g/dL   Albumin 3.9 (L) 4.0 - 5.0 g/dL   Globulin, Total 2.7 1.5 - 4.5 g/dL   Bilirubin Total <9.7 0.0 - 1.2 mg/dL   Alkaline Phosphatase 77 44 - 121 IU/L   AST 15 0 - 40 IU/L   ALT 27 0 - 32 IU/L  TSH  Result Value Ref Range   TSH 0.256 (L) 0.450 - 4.500 uIU/mL  Hemoglobin A1c  Result Value Ref Range   Hgb A1c MFr Bld 5.5 4.8 - 5.6 %   Est. average glucose Bld gHb Est-mCnc 111 mg/dL  Vitamin A87  Result Value Ref Range   Vitamin B-12 616 232 - 1,245 pg/mL  CBC with Diff  Result Value Ref Range   WBC 8.0 3.4 - 10.8 x10E3/uL   RBC 4.30 3.77 - 5.28 x10E6/uL   Hemoglobin 13.3 11.1 - 15.9 g/dL   Hematocrit 59.9 65.9 - 46.6 %   MCV 93 79 - 97 fL   MCH 30.9 26.6 - 33.0 pg   MCHC 33.3 31.5 - 35.7 g/dL   RDW 87.4 88.2 - 84.5 %   Platelets 204 150 - 450 x10E3/uL   Neutrophils 50 Not Estab. %   Lymphs 39 Not Estab. %   Monocytes 10 Not Estab. %   Eos 1 Not Estab. %   Basos 0 Not Estab. %   Neutrophils Absolute 4.0 1.4 - 7.0 x10E3/uL   Lymphocytes Absolute 3.1 0.7 - 3.1 x10E3/uL   Monocytes Absolute 0.8 0.1 - 0.9 x10E3/uL   EOS (ABSOLUTE) 0.1 0.0 - 0.4 x10E3/uL   Basophils Absolute 0.0 0.0 - 0.2 x10E3/uL   Immature Granulocytes 0 Not Estab. %   Immature Grans (Abs) 0.0 0.0 - 0.1 x10E3/uL    Recent Results (from the past 2160 hours)  Lipid panel     Status: Abnormal   Collection Time: 10/23/23 10:25 AM  Result Value Ref Range   Cholesterol, Total 137 100 - 199 mg/dL   Triglycerides 801 (H) 0 - 149 mg/dL   HDL 32 (L) >60 mg/dL   VLDL  Cholesterol Cal 33 5 - 40 mg/dL   LDL Chol Calc (NIH) 72 0 - 99 mg/dL   Chol/HDL Ratio 4.3 0.0 - 4.4 ratio    Comment:                                   T. Chol/HDL Ratio                                             Men  Women                               1/2 Avg.Risk  3.4    3.3                                    Avg.Risk  5.0    4.4                                2X Avg.Risk  9.6    7.1                                3X Avg.Risk 23.4   11.0   VITAMIN D  25 Hydroxy (Vit-D Deficiency, Fractures)     Status: None   Collection Time: 10/23/23 10:25 AM  Result Value Ref Range   Vit D, 25-Hydroxy 42.9 30.0 - 100.0 ng/mL    Comment: Vitamin D  deficiency has been defined by the Institute of Medicine and an Endocrine Society practice guideline as a level of serum 25-OH vitamin D  less than 20 ng/mL (1,2). The Endocrine Society went on to further define vitamin D  insufficiency as a level between 21 and 29 ng/mL (2). 1. IOM (Institute of Medicine). 2010. Dietary reference    intakes for calcium and D. [place]: The    [employer]. 2. Holick MF, Bink[employer], Bischoff-Ferrari HA, et al.    Evaluation, treatment, and prevention of vitamin D     deficiency: an Endocrine Society clinical practice    guideline. JCEM. 2011 Jul; 96(7):1911-30.   CMP14+EGFR     Status: Abnormal   Collection Time: 10/23/23 10:25 AM  Result Value Ref Range   Glucose 74 70 - 99 mg/dL   BUN 12 6 - 20 mg/dL   Creatinine, Ser 9.30 0.57 - 1.00 mg/dL   eGFR 874 >40 fO/fpw/8.26   BUN/Creatinine Ratio 17 9 - 23   Sodium 141 134 - 144 mmol/L   Potassium 4.2 3.5 - 5.2 mmol/L   Chloride 103 96 - 106 mmol/L   CO2 24 20 - 29 mmol/L   Calcium 9.3 8.7 - 10.2 mg/dL   Total Protein 6.6 6.0 - 8.5 g/dL   Albumin 3.9 (L) 4.0 - 5.0 g/dL   Globulin, Total 2.7 1.5 - 4.5 g/dL   Bilirubin Total <9.7 0.0 - 1.2 mg/dL   Alkaline Phosphatase 77 44 - 121 IU/L   AST 15 0 - 40 IU/L   ALT 27 0 - 32 IU/L  TSH     Status: Abnormal   Collection Time: 10/23/23 10:25 AM  Result Value Ref Range   TSH 0.256 (L) 0.450 - 4.500 uIU/mL  Hemoglobin A1c     Status: None   Collection Time: 10/23/23 10:25 AM  Result Value Ref Range   Hgb A1c MFr Bld 5.5 4.8 - 5.6 %    Comment:          Prediabetes: 5.7 - 6.4          Diabetes: >6.4           Glycemic control for adults with diabetes: <7.0    Est. average glucose Bld gHb Est-mCnc 111 mg/dL  Vitamin B12     Status: None   Collection Time: 10/23/23 10:25 AM  Result Value Ref Range   Vitamin B-12 616 232 - 1,245 pg/mL  CBC with Diff     Status: None   Collection Time: 10/23/23 10:25 AM  Result Value Ref Range   WBC 8.0 3.4 - 10.8 x10E3/uL   RBC 4.30 3.77 - 5.28 x10E6/uL   Hemoglobin 13.3 11.1 - 15.9 g/dL   Hematocrit 59.9 65.9 - 46.6 %   MCV 93 79 - 97 fL   MCH 30.9 26.6 - 33.0 pg   MCHC 33.3 31.5 - 35.7 g/dL   RDW 87.4 88.2 - 84.5 %   Platelets 204 150 - 450 x10E3/uL   Neutrophils 50 Not Estab. %   Lymphs 39 Not Estab. %   Monocytes 10 Not Estab. %   Eos 1 Not Estab. %   Basos 0 Not Estab. %   Neutrophils Absolute 4.0 1.4 - 7.0 x10E3/uL   Lymphocytes Absolute 3.1 0.7 - 3.1 x10E3/uL   Monocytes Absolute 0.8 0.1 - 0.9 x10E3/uL   EOS (ABSOLUTE) 0.1 0.0 - 0.4 x10E3/uL   Basophils Absolute 0.0 0.0 - 0.2 x10E3/uL   Immature Granulocytes 0 Not Estab. %   Immature Grans (Abs) 0.0 0.0 - 0.1 x10E3/uL       Assessment & Plan:   Problem List Items Addressed This Visit       Other   Obesity, Class III, BMI 40-49.9 (morbid obesity) (HCC)   Continue current meds.  Will adjust as needed based on results.  The patient is asked to make an attempt to improve diet and exercise patterns to aid in medical management of this problem. Addressed importance of increasing and maintaining water intake.        Other fatigue - Primary   Relevant Orders   CMP14+EGFR (Completed)   TSH (Completed)   CBC with Diff (Completed)   Prediabetes   A1C Continues to be in prediabetic ranges.  Will reassess at follow up after next lab check.  Patient counseled on dietary choices and verbalized understanding.  Patient educated on foods that contain carbohydrates and the need to decrease intake.  We discussed prediabetes, and what it means and the need for strict dietary control to prevent  progression to type 2 diabetes.  Advised to decrease intake of sugary drinks, including sodas, sweet tea, and some juices, and of starch and sugar heavy foods (ie., potatoes, rice, bread, pasta, desserts). She verbalizes understanding and agreement with the changes discussed today.        Relevant Orders   CMP14+EGFR (Completed)   Hemoglobin A1c (Completed)   CBC with Diff (Completed)   Other Visit Diagnoses       B12 deficiency due to diet       Checking labs today.  Will continue supplements as needed.   Relevant Orders   CMP14+EGFR (Completed)  Vitamin B12 (Completed)   CBC with Diff (Completed)     Vitamin D  deficiency, unspecified       Checking labs today.  Will continue supplements as needed.   Relevant Orders   VITAMIN D  25 Hydroxy (Vit-D Deficiency, Fractures) (Completed)   CMP14+EGFR (Completed)   CBC with Diff (Completed)     Mixed hyperlipidemia       Checking labs today.  Continue current therapy for lipid control. Will modify as needed based on labwork results.   Relevant Orders   Lipid panel (Completed)   CMP14+EGFR (Completed)   CBC with Diff (Completed)     Essential hypertension, benign       Blood pressure well controlled with current medications.  Continue current therapy.  Will reassess at follow up.   Relevant Orders   CMP14+EGFR (Completed)   CBC with Diff (Completed)     Encounter for general counseling and advice on contraceptive management       Patient given information to determine which option is the best fit for her.  We will discuss at follow up.       Return in about 1 month (around 11/23/2023).   Total time spent: 20 minutes  ALAN CHRISTELLA ARRANT, FNP  10/23/2023   This document may have been prepared by [facility] Voice Recognition software and as such may include unintentional dictation errors.

## 2023-10-24 LAB — TSH: TSH: 0.256 u[IU]/mL — ABNORMAL LOW (ref 0.450–4.500)

## 2023-10-24 LAB — CBC WITH DIFFERENTIAL/PLATELET
Basophils Absolute: 0 10*3/uL (ref 0.0–0.2)
Basos: 0 %
EOS (ABSOLUTE): 0.1 10*3/uL (ref 0.0–0.4)
Eos: 1 %
Hematocrit: 40 % (ref 34.0–46.6)
Hemoglobin: 13.3 g/dL (ref 11.1–15.9)
Immature Grans (Abs): 0 10*3/uL (ref 0.0–0.1)
Immature Granulocytes: 0 %
Lymphocytes Absolute: 3.1 10*3/uL (ref 0.7–3.1)
Lymphs: 39 %
MCH: 30.9 pg (ref 26.6–33.0)
MCHC: 33.3 g/dL (ref 31.5–35.7)
MCV: 93 fL (ref 79–97)
Monocytes Absolute: 0.8 10*3/uL (ref 0.1–0.9)
Monocytes: 10 %
Neutrophils Absolute: 4 10*3/uL (ref 1.4–7.0)
Neutrophils: 50 %
Platelets: 204 10*3/uL (ref 150–450)
RBC: 4.3 x10E6/uL (ref 3.77–5.28)
RDW: 12.5 % (ref 11.7–15.4)
WBC: 8 10*3/uL (ref 3.4–10.8)

## 2023-10-24 LAB — VITAMIN B12: Vitamin B-12: 616 pg/mL (ref 232–1245)

## 2023-10-24 LAB — CMP14+EGFR
ALT: 27 [IU]/L (ref 0–32)
AST: 15 [IU]/L (ref 0–40)
Albumin: 3.9 g/dL — ABNORMAL LOW (ref 4.0–5.0)
Alkaline Phosphatase: 77 [IU]/L (ref 44–121)
BUN/Creatinine Ratio: 17 (ref 9–23)
BUN: 12 mg/dL (ref 6–20)
Bilirubin Total: <0.2 mg/dL (ref 0.0–1.2)
CO2: 24 mmol/L (ref 20–29)
Calcium: 9.3 mg/dL (ref 8.7–10.2)
Chloride: 103 mmol/L (ref 96–106)
Creatinine, Ser: 0.69 mg/dL (ref 0.57–1.00)
Globulin, Total: 2.7 g/dL (ref 1.5–4.5)
Glucose: 74 mg/dL (ref 70–99)
Potassium: 4.2 mmol/L (ref 3.5–5.2)
Sodium: 141 mmol/L (ref 134–144)
Total Protein: 6.6 g/dL (ref 6.0–8.5)
eGFR: 125 mL/min/{1.73_m2} (ref >59)

## 2023-10-24 LAB — LIPID PANEL
Chol/HDL Ratio: 4.3 {ratio} (ref 0.0–4.4)
Cholesterol, Total: 137 mg/dL (ref 100–199)
HDL: 32 mg/dL — ABNORMAL LOW (ref >39)
LDL Chol Calc (NIH): 72 mg/dL (ref 0–99)
Triglycerides: 198 mg/dL — ABNORMAL HIGH (ref 0–149)
VLDL Cholesterol Cal: 33 mg/dL (ref 5–40)

## 2023-10-24 LAB — HEMOGLOBIN A1C
Est. average glucose Bld gHb Est-mCnc: 111 mg/dL
Hgb A1c MFr Bld: 5.5 % (ref 4.8–5.6)

## 2023-10-24 LAB — VITAMIN D 25 HYDROXY (VIT D DEFICIENCY, FRACTURES): Vit D, 25-Hydroxy: 42.9 ng/mL (ref 30.0–100.0)

## 2023-10-26 ENCOUNTER — Encounter: Payer: Self-pay | Admitting: Family

## 2023-10-26 NOTE — Assessment & Plan Note (Signed)
 Continue current meds.  Will adjust as needed based on results.  The patient is asked to make an attempt to improve diet and exercise patterns to aid in medical management of this problem. Addressed importance of increasing and maintaining water intake.

## 2023-10-26 NOTE — Assessment & Plan Note (Signed)

## 2023-11-25 ENCOUNTER — Ambulatory Visit: Payer: BC Managed Care – PPO | Admitting: Family

## 2023-12-10 ENCOUNTER — Encounter: Payer: Self-pay | Admitting: Family

## 2023-12-10 ENCOUNTER — Ambulatory Visit (INDEPENDENT_AMBULATORY_CARE_PROVIDER_SITE_OTHER): Payer: BC Managed Care – PPO | Admitting: Family

## 2023-12-10 VITALS — BP 110/82 | HR 71 | Ht 62.0 in | Wt 235.2 lb

## 2023-12-10 DIAGNOSIS — Z0130 Encounter for examination of blood pressure without abnormal findings: Secondary | ICD-10-CM

## 2023-12-10 DIAGNOSIS — F900 Attention-deficit hyperactivity disorder, predominantly inattentive type: Secondary | ICD-10-CM | POA: Diagnosis not present

## 2023-12-10 DIAGNOSIS — E66813 Obesity, class 3: Secondary | ICD-10-CM

## 2023-12-10 MED ORDER — LISDEXAMFETAMINE DIMESYLATE 40 MG PO CAPS: 40.0000 mg | ORAL_CAPSULE | ORAL | 0 refills | Status: DC

## 2023-12-10 NOTE — Progress Notes (Signed)
 Established Patient Office Visit  Subjective:  Patient ID: Julie Pierce, female    DOB: August 30, 2001  Age: 23 y.o. MRN: [id number]  Chief Complaint  Patient presents with   Follow-up    Weight management    Patient here today for a weight management visit.  She is continuing to have trouble with weight loss, asks if there are other meds we can try  She also is concerned for possible ADHD.  She has had symptoms for pretty much her whole life, but has never previously been officially diagnosed.   No other concerns today.    No other concerns at this time.   Past Medical History:  Diagnosis Date   Obesity, Class III, BMI 40-49.9 (morbid obesity) (HCC) 02/26/2023   Other fatigue 02/26/2023   Prediabetes 02/26/2023    Past Surgical History:  Procedure Laterality Date   TYMPANOSTOMY TUBE PLACEMENT  2008    Social History   Socioeconomic History   Marital status: Significant Other    Spouse name: Not on file   Number of children: Not on file   Years of education: Not on file   Highest education level: Not on file  Occupational History   Not on file  Tobacco Use   Smoking status: Never   Smokeless tobacco: Not on file  Vaping Use   Vaping status: Some Days  Substance and Sexual Activity   Alcohol use: Not Currently   Drug use: Never   Sexual activity: Yes    Birth control/protection: Implant  Other Topics Concern   Not on file  Social History Narrative   Not on file   Social Drivers of Health   Financial Resource Strain: Not on file  Food Insecurity: Not on file  Transportation Needs: Not on file  Physical Activity: Not on file  Stress: Not on file  Social Connections: Not on file  Intimate Partner Violence: Not on file    Family History  Problem Relation Age of Onset   Diabetes Mother    ADD / ADHD Father    Hypertension Father    Hyperlipidemia Father     No Known Allergies  Review of Systems  All other systems reviewed and are  negative.      Objective:   BP 110/82   Pulse 71   Ht 5\' 2"  (1.575 m)   Wt 235 lb 3.2 oz (106.7 kg)   SpO2 98%   BMI 43.02 kg/m   Vitals:   12/10/23 1121  BP: 110/82  Pulse: 71  Height: 5\' 2"  (1.575 m)  Weight: 235 lb 3.2 oz (106.7 kg)  SpO2: 98%  BMI (Calculated): 43.01    Physical Exam Vitals and nursing note reviewed.  Constitutional:      Appearance: Normal appearance. She is obese.  HENT:     Head: Normocephalic.  Eyes:     Extraocular Movements: Extraocular movements intact.     Conjunctiva/sclera: Conjunctivae normal.     Pupils: Pupils are equal, round, and reactive to light.  Cardiovascular:     Rate and Rhythm: Normal rate.  Pulmonary:     Effort: Pulmonary effort is normal.  Neurological:     General: No focal deficit present.     Mental Status: She is alert and oriented to person, place, and time. Mental status is at baseline.  Psychiatric:        Mood and Affect: Mood normal.        Behavior: Behavior normal.  Thought Content: Thought content normal.        Judgment: Judgment normal.      No results found for any visits on 12/10/23.  Recent Results (from the past 2160 hours)  ToxAssure Select,+Antidepr,UR     Status: None   Collection Time: 01/07/24  5:57 AM  Result Value Ref Range   Summary FINAL     Comment: ==================================================================== ToxAssure Select,+Antidepr,UR ==================================================================== Test                             Result       Flag       Units  Drug Present   Amphetamine                    1969                    ng/mg creat    Amphetamine is available as a schedule II prescription drug.  ==================================================================== Test                      Result    Flag   Units      Ref Range   Creatinine              157              mg/dL       >=30 ==================================================================== Declared Medications:  Medication list was not provided. ==================================================================== For clinical consultation, please call [phone]. ====================================================================        Assessment & Plan:   Problem List Items Addressed This Visit       Other   Obesity, Class III, BMI 40-49.9 (morbid obesity) - Primary   ADHD (attention deficit hyperactivity disorder), inattentive type   Starting patient on low dose of Vyvanse . Will recheck her symptoms and etc at follow up.   Reassess in 1 month.       Return in about 1 month (around 01/07/2024) for F/U.   Total time spent: 20 minutes  Trenda Frisk, FNP  12/10/2023   This document may have been prepared by [facility] Voice Recognition software and as such may include unintentional dictation errors.

## 2023-12-19 ENCOUNTER — Ambulatory Visit: Payer: BC Managed Care – PPO | Admitting: Family

## 2024-01-07 ENCOUNTER — Ambulatory Visit: Payer: BC Managed Care – PPO | Admitting: Family

## 2024-01-07 ENCOUNTER — Encounter: Payer: Self-pay | Admitting: Family

## 2024-01-07 VITALS — BP 120/86 | HR 82 | Ht 62.0 in | Wt 228.6 lb

## 2024-01-07 DIAGNOSIS — R7303 Prediabetes: Secondary | ICD-10-CM

## 2024-01-07 DIAGNOSIS — E66813 Obesity, class 3: Secondary | ICD-10-CM

## 2024-01-07 DIAGNOSIS — F900 Attention-deficit hyperactivity disorder, predominantly inattentive type: Secondary | ICD-10-CM | POA: Diagnosis not present

## 2024-01-07 DIAGNOSIS — Z79899 Other long term (current) drug therapy: Secondary | ICD-10-CM

## 2024-01-07 MED ORDER — LISDEXAMFETAMINE DIMESYLATE 40 MG PO CAPS: 40.0000 mg | ORAL_CAPSULE | ORAL | 0 refills | Status: DC

## 2024-01-07 MED ORDER — FENOFIBRATE 145 MG PO TABS: 145.0000 mg | ORAL_TABLET | Freq: Every day | ORAL | 3 refills | Status: AC

## 2024-01-07 MED ORDER — VITAMIN D (ERGOCALCIFEROL) 1.25 MG (50000 UNIT) PO CAPS: 50000.0000 [IU] | ORAL_CAPSULE | ORAL | 1 refills | Status: DC

## 2024-01-07 MED ORDER — WEGOVY 0.25 MG/0.5ML ~~LOC~~ SOAJ: 0.2500 mg | SUBCUTANEOUS | 0 refills | Status: DC

## 2024-01-07 NOTE — Progress Notes (Signed)
 Established Patient Office Visit  Subjective:  Patient ID: Julie Pierce, female    DOB: 2001-06-08  Age: 23 y.o. MRN: [id number]  Chief Complaint  Patient presents with   Follow-up    1 month follow up    Patient is here today for her 1 month follow up.  She has been feeling well since last appointment.   She does not have additional concerns to discuss today.  Labs are not due today. She needs refills.   I have reviewed her active problem list, medication list, allergies, health maintenance, notes from last encounter, lab results for her appointment today.      No other concerns at this time.   Past Medical History:  Diagnosis Date   Obesity, Class III, BMI 40-49.9 (morbid obesity) (HCC) 02/26/2023   Other fatigue 02/26/2023   Prediabetes 02/26/2023    Past Surgical History:  Procedure Laterality Date   TYMPANOSTOMY TUBE PLACEMENT  2008    Social History   Socioeconomic History   Marital status: Significant Other    Spouse name: Not on file   Number of children: Not on file   Years of education: Not on file   Highest education level: Not on file  Occupational History   Not on file  Tobacco Use   Smoking status: Never   Smokeless tobacco: Not on file  Vaping Use   Vaping status: Some Days  Substance and Sexual Activity   Alcohol use: Not Currently   Drug use: Never   Sexual activity: Yes    Birth control/protection: Implant  Other Topics Concern   Not on file  Social History Narrative   Not on file   Social Drivers of Health   Financial Resource Strain: Not on file  Food Insecurity: Not on file  Transportation Needs: Not on file  Physical Activity: Not on file  Stress: Not on file  Social Connections: Not on file  Intimate Partner Violence: Not on file    Family History  Problem Relation Age of Onset   Diabetes Mother    ADD / ADHD Father    Hypertension Father    Hyperlipidemia Father     No Known Allergies  Review of Systems   All other systems reviewed and are negative.      Objective:   BP 120/86   Pulse 82   Ht 5\' 2"  (1.575 m)   Wt 228 lb 9.6 oz (103.7 kg)   SpO2 98%   BMI 41.81 kg/m   Vitals:   01/07/24 1032  BP: 120/86  Pulse: 82  Height: 5\' 2"  (1.575 m)  Weight: 228 lb 9.6 oz (103.7 kg)  SpO2: 98%  BMI (Calculated): 41.8    Physical Exam Vitals and nursing note reviewed.  Constitutional:      Appearance: Normal appearance. She is obese.  HENT:     Head: Normocephalic.  Eyes:     Extraocular Movements: Extraocular movements intact.     Conjunctiva/sclera: Conjunctivae normal.     Pupils: Pupils are equal, round, and reactive to light.  Cardiovascular:     Rate and Rhythm: Normal rate.  Pulmonary:     Effort: Pulmonary effort is normal.  Neurological:     General: No focal deficit present.     Mental Status: She is alert and oriented to person, place, and time. Mental status is at baseline.  Psychiatric:        Mood and Affect: Mood normal.        Behavior:  Behavior normal.        Thought Content: Thought content normal.        Judgment: Judgment normal.      Results for orders placed or performed in visit on 01/07/24  ToxAssure Select,+Antidepr,UR  Result Value Ref Range   Summary FINAL     Recent Results (from the past 2160 hours)  ToxAssure Select,+Antidepr,UR     Status: None   Collection Time: 01/07/24  5:57 AM  Result Value Ref Range   Summary FINAL     Comment: ==================================================================== ToxAssure Select,+Antidepr,UR ==================================================================== Test                             Result       Flag       Units  Drug Present   Amphetamine                    1969                    ng/mg creat    Amphetamine is available as a schedule II prescription drug.  ==================================================================== Test                      Result    Flag   Units       Ref Range   Creatinine              157              mg/dL      >=16 ==================================================================== Declared Medications:  Medication list was not provided. ==================================================================== For clinical consultation, please call [phone]. ====================================================================        Assessment & Plan:   Problem List Items Addressed This Visit       Other   Obesity, Class III, BMI 40-49.9 (morbid obesity)   Continue current meds.  Will adjust as needed based on results.  The patient is asked to make an attempt to improve diet and exercise patterns to aid in medical management of this problem. Addressed importance of increasing and maintaining water intake.        Relevant Medications   Semaglutide -Weight Management (WEGOVY ) 0.25 MG/0.5ML SOAJ   Prediabetes   Patient educated on foods that contain carbohydrates and the need to decrease intake.  We discussed prediabetes, and what it means and the need for strict dietary control to prevent progression to type 2 diabetes.  Advised to decrease intake of sugary drinks, including sodas, sweet tea, and some juices, and of starch and sugar heavy foods (ie., potatoes, rice, bread, pasta, desserts). She verbalizes understanding and agreement with the changes discussed today.  A1C Continues to be in prediabetic ranges.  Will reassess at follow up after next lab check.  Patient counseled on dietary choices and verbalized understanding.        ADHD (attention deficit hyperactivity disorder), inattentive type   Patient stable.  Well controlled with current therapy.   Continue current meds.        Other Visit Diagnoses       Long term current use of therapeutic drug    -  Primary   sending UDT for patient to evaluate levels.  Will further evaluate as needed.   Relevant Orders   ToxAssure Select,+Antidepr,UR (Completed)        Return in about 2 months (around 03/08/2024) for F/U.   Total time spent: 20 minutes  Trenda Frisk, FNP  01/07/2024   This document may have been prepared by [facility] Voice Recognition software and as such may include unintentional dictation errors.

## 2024-01-09 LAB — TOXASSURE SELECT,+ANTIDEPR,UR

## 2024-02-11 ENCOUNTER — Other Ambulatory Visit: Payer: Self-pay | Admitting: Family

## 2024-02-11 MED ORDER — LISDEXAMFETAMINE DIMESYLATE 40 MG PO CAPS: 40.0000 mg | ORAL_CAPSULE | ORAL | 0 refills | Status: DC

## 2024-02-16 DIAGNOSIS — F900 Attention-deficit hyperactivity disorder, predominantly inattentive type: Secondary | ICD-10-CM | POA: Insufficient documentation

## 2024-02-16 NOTE — Assessment & Plan Note (Signed)
 Starting patient on low dose of Vyvanse . Will recheck her symptoms and etc at follow up.   Reassess in 1 month.

## 2024-03-07 NOTE — Assessment & Plan Note (Signed)
 Continue current meds.  Will adjust as needed based on results.  The patient is asked to make an attempt to improve diet and exercise patterns to aid in medical management of this problem. Addressed importance of increasing and maintaining water intake.

## 2024-03-07 NOTE — Assessment & Plan Note (Signed)
 Patient stable.  Well controlled with current therapy.   Continue current meds.

## 2024-03-07 NOTE — Assessment & Plan Note (Signed)

## 2024-03-09 ENCOUNTER — Ambulatory Visit (INDEPENDENT_AMBULATORY_CARE_PROVIDER_SITE_OTHER): Admitting: Family

## 2024-03-09 ENCOUNTER — Encounter: Payer: Self-pay | Admitting: Family

## 2024-03-09 VITALS — BP 118/78 | HR 74 | Ht 62.0 in | Wt 221.0 lb

## 2024-03-09 DIAGNOSIS — E559 Vitamin D deficiency, unspecified: Secondary | ICD-10-CM

## 2024-03-09 DIAGNOSIS — E782 Mixed hyperlipidemia: Secondary | ICD-10-CM | POA: Diagnosis not present

## 2024-03-09 DIAGNOSIS — Z0130 Encounter for examination of blood pressure without abnormal findings: Secondary | ICD-10-CM

## 2024-03-09 DIAGNOSIS — F900 Attention-deficit hyperactivity disorder, predominantly inattentive type: Secondary | ICD-10-CM

## 2024-03-09 DIAGNOSIS — R7303 Prediabetes: Secondary | ICD-10-CM

## 2024-03-09 DIAGNOSIS — R5383 Other fatigue: Secondary | ICD-10-CM

## 2024-03-09 DIAGNOSIS — E538 Deficiency of other specified B group vitamins: Secondary | ICD-10-CM

## 2024-03-09 DIAGNOSIS — E66813 Obesity, class 3: Secondary | ICD-10-CM | POA: Diagnosis not present

## 2024-03-09 NOTE — Assessment & Plan Note (Signed)

## 2024-03-09 NOTE — Assessment & Plan Note (Signed)
 Patient stable.  Well controlled with current therapy.   Continue current meds.

## 2024-03-09 NOTE — Progress Notes (Signed)
 Established Patient Office Visit  Subjective:  Patient ID: Julie Pierce, female    DOB: 08/05/2001  Age: 23 y.o. MRN: [id number]  Chief Complaint  Patient presents with   Follow-up    2 month follow up    Patient is here today for her 2 months follow up.  She has been feeling well since last appointment.   She does have additional concerns to discuss today.  She asks if we can try again to get the PA for the Wegovy , I have found that it does appear her insurance covers this.  She also asks if we can recheck her labs, as it has been a few months since we last did. Labs are due today anyway.   She needs refills.   I have reviewed her active problem list, medication list, allergies, notes from last encounter, lab results for her appointment today.      No other concerns at this time.   Past Medical History:  Diagnosis Date   Obesity, Class III, BMI 40-49.9 (morbid obesity) 02/26/2023   Other fatigue 02/26/2023   Prediabetes 02/26/2023    Past Surgical History:  Procedure Laterality Date   TYMPANOSTOMY TUBE PLACEMENT  2008    Social History   Socioeconomic History   Marital status: Significant Other    Spouse name: Not on file   Number of children: Not on file   Years of education: Not on file   Highest education level: Not on file  Occupational History   Not on file  Tobacco Use   Smoking status: Never   Smokeless tobacco: Not on file  Vaping Use   Vaping status: Some Days  Substance and Sexual Activity   Alcohol use: Not Currently   Drug use: Never   Sexual activity: Yes    Birth control/protection: Implant  Other Topics Concern   Not on file  Social History Narrative   Not on file   Social Drivers of Health   Financial Resource Strain: Not on file  Food Insecurity: Not on file  Transportation Needs: Not on file  Physical Activity: Not on file  Stress: Not on file  Social Connections: Not on file  Intimate Partner Violence: Not on file     Family History  Problem Relation Age of Onset   Diabetes Mother    ADD / ADHD Father    Hypertension Father    Hyperlipidemia Father     No Known Allergies  Review of Systems  All other systems reviewed and are negative.      Objective:   BP 118/78   Pulse 74   Ht 5\' 2"  (1.575 m)   Wt 221 lb (100.2 kg)   SpO2 97%   BMI 40.42 kg/m   Vitals:   03/09/24 0915  BP: 118/78  Pulse: 74  Height: 5\' 2"  (1.575 m)  Weight: 221 lb (100.2 kg)  SpO2: 97%  BMI (Calculated): 40.41    Physical Exam Vitals and nursing note reviewed.  Constitutional:      Appearance: Normal appearance. She is normal weight.  HENT:     Head: Normocephalic.  Eyes:     Extraocular Movements: Extraocular movements intact.     Conjunctiva/sclera: Conjunctivae normal.     Pupils: Pupils are equal, round, and reactive to light.  Cardiovascular:     Rate and Rhythm: Normal rate.  Pulmonary:     Effort: Pulmonary effort is normal.  Neurological:     General: No focal deficit present.  Mental Status: She is alert and oriented to person, place, and time. Mental status is at baseline.  Psychiatric:        Mood and Affect: Mood normal.        Behavior: Behavior normal.        Thought Content: Thought content normal.        Judgment: Judgment normal.      No results found for any visits on 03/09/24.  Recent Results (from the past 2160 hours)  ToxAssure Select,+Antidepr,UR     Status: None   Collection Time: 01/07/24  5:57 AM  Result Value Ref Range   Summary FINAL     Comment: ==================================================================== ToxAssure Select,+Antidepr,UR ==================================================================== Test                             Result       Flag       Units  Drug Present   Amphetamine                    1969                    ng/mg creat    Amphetamine is available as a schedule II prescription  drug.  ==================================================================== Test                      Result    Flag   Units      Ref Range   Creatinine              157              mg/dL      >=40 ==================================================================== Declared Medications:  Medication list was not provided. ==================================================================== For clinical consultation, please call [phone]. ====================================================================        Assessment & Plan:   Problem List Items Addressed This Visit       Other   Obesity, Class III, BMI 40-49.9 (morbid obesity) - Primary   Will try to get PA approved for patient again.  Given her conditions as well as her weight and BMI, I strongly feel that she would benefit.   Reassess at follow up.      Relevant Orders   CMP14+EGFR   CBC with Diff   Other fatigue   Relevant Orders   TSH   CBC with Diff   Prediabetes   Patient educated on foods that contain carbohydrates and the need to decrease intake.  We discussed prediabetes, and what it means and the need for strict dietary control to prevent progression to type 2 diabetes.  Advised to decrease intake of sugary drinks, including sodas, sweet tea, and some juices, and of starch and sugar heavy foods (ie., potatoes, rice, bread, pasta, desserts). She verbalizes understanding and agreement with the changes discussed today.  A1C Continues to be in prediabetic ranges.  Will reassess at follow up after next lab check.  Patient counseled on dietary choices and verbalized understanding.        Relevant Orders   CMP14+EGFR   Hemoglobin A1c   CBC with Diff   ADHD (attention deficit hyperactivity disorder), inattentive type   Patient stable.  Well controlled with current therapy.   Continue current meds.        Relevant Orders   CMP14+EGFR   CBC with Diff   Other Visit Diagnoses       B12  deficiency  due to diet       Checking labs today.  Will continue supplements as needed.   Relevant Orders   CMP14+EGFR   Vitamin B12   CBC with Diff     Vitamin D  deficiency, unspecified       Checking labs today.  Will continue supplements as needed.   Relevant Orders   VITAMIN D  25 Hydroxy (Vit-D Deficiency, Fractures)   CMP14+EGFR   CBC with Diff     Mixed hyperlipidemia       Checking labs today.  Continue current therapy for lipid control. Will modify as needed based on labwork results.   Relevant Orders   Lipid panel   CMP14+EGFR   CBC with Diff       Return in about 3 months (around 06/09/2024).   Total time spent: 20 minutes  Trenda Frisk, FNP  03/09/2024   This document may have been prepared by [facility] Voice Recognition software and as such may include unintentional dictation errors.

## 2024-03-09 NOTE — Assessment & Plan Note (Signed)
 Will try to get PA approved for patient again.  Given her conditions as well as her weight and BMI, I strongly feel that she would benefit.   Reassess at follow up.

## 2024-03-11 ENCOUNTER — Other Ambulatory Visit: Payer: Self-pay | Admitting: Family

## 2024-03-11 MED ORDER — WEGOVY 0.5 MG/0.5ML ~~LOC~~ SOAJ
0.5000 mg | SUBCUTANEOUS | 0 refills | Status: DC
Start: 2024-03-11 — End: 2024-06-17

## 2024-03-16 ENCOUNTER — Other Ambulatory Visit: Payer: Self-pay

## 2024-03-17 MED ORDER — LISDEXAMFETAMINE DIMESYLATE 40 MG PO CAPS: 40.0000 mg | ORAL_CAPSULE | ORAL | 0 refills | Status: DC

## 2024-03-24 LAB — LIPID PANEL
Chol/HDL Ratio: 3.1 ratio (ref 0.0–4.4)
Cholesterol, Total: 123 mg/dL (ref 100–199)
HDL: 40 mg/dL (ref >39)
LDL Chol Calc (NIH): 66 mg/dL (ref 0–99)
Triglycerides: 89 mg/dL (ref 0–149)
VLDL Cholesterol Cal: 17 mg/dL (ref 5–40)

## 2024-03-24 LAB — CMP14+EGFR
ALT: 25 IU/L (ref 0–32)
AST: 15 IU/L (ref 0–40)
Albumin: 4.1 g/dL (ref 4.0–5.0)
Alkaline Phosphatase: 66 IU/L (ref 44–121)
BUN/Creatinine Ratio: 13 (ref 9–23)
BUN: 10 mg/dL (ref 6–20)
Bilirubin Total: 0.2 mg/dL (ref 0.0–1.2)
CO2: 20 mmol/L (ref 20–29)
Calcium: 9.1 mg/dL (ref 8.7–10.2)
Chloride: 104 mmol/L (ref 96–106)
Creatinine, Ser: 0.77 mg/dL (ref 0.57–1.00)
Globulin, Total: 2.6 g/dL (ref 1.5–4.5)
Glucose: 84 mg/dL (ref 70–99)
Potassium: 4.1 mmol/L (ref 3.5–5.2)
Sodium: 139 mmol/L (ref 134–144)
Total Protein: 6.7 g/dL (ref 6.0–8.5)
eGFR: 111 mL/min/{1.73_m2} (ref >59)

## 2024-03-24 LAB — HEMOGLOBIN A1C
Est. average glucose Bld gHb Est-mCnc: 108 mg/dL
Hgb A1c MFr Bld: 5.4 % (ref 4.8–5.6)

## 2024-03-24 LAB — CBC WITH DIFFERENTIAL/PLATELET
Basophils Absolute: 0 10*3/uL (ref 0.0–0.2)
Basos: 1 %
EOS (ABSOLUTE): 0.1 10*3/uL (ref 0.0–0.4)
Eos: 2 %
Hematocrit: 40.6 % (ref 34.0–46.6)
Hemoglobin: 13.2 g/dL (ref 11.1–15.9)
Immature Grans (Abs): 0 10*3/uL (ref 0.0–0.1)
Immature Granulocytes: 0 %
Lymphocytes Absolute: 2.6 10*3/uL (ref 0.7–3.1)
Lymphs: 31 %
MCH: 31 pg (ref 26.6–33.0)
MCHC: 32.5 g/dL (ref 31.5–35.7)
MCV: 95 fL (ref 79–97)
Monocytes Absolute: 0.8 10*3/uL (ref 0.1–0.9)
Monocytes: 9 %
Neutrophils Absolute: 4.9 10*3/uL (ref 1.4–7.0)
Neutrophils: 57 %
Platelets: 199 10*3/uL (ref 150–450)
RBC: 4.26 x10E6/uL (ref 3.77–5.28)
RDW: 12.5 % (ref 11.7–15.4)
WBC: 8.5 10*3/uL (ref 3.4–10.8)

## 2024-03-24 LAB — TSH: TSH: 1.69 u[IU]/mL (ref 0.450–4.500)

## 2024-03-24 LAB — VITAMIN B12: Vitamin B-12: 603 pg/mL (ref 232–1245)

## 2024-03-24 LAB — VITAMIN D 25 HYDROXY (VIT D DEFICIENCY, FRACTURES): Vit D, 25-Hydroxy: 69.6 ng/mL (ref 30.0–100.0)

## 2024-03-27 ENCOUNTER — Ambulatory Visit: Payer: Self-pay

## 2024-04-21 ENCOUNTER — Other Ambulatory Visit: Payer: Self-pay

## 2024-04-23 MED ORDER — LISDEXAMFETAMINE DIMESYLATE 40 MG PO CAPS: 40.0000 mg | ORAL_CAPSULE | ORAL | 0 refills | Status: DC

## 2024-05-25 ENCOUNTER — Other Ambulatory Visit: Payer: Self-pay

## 2024-05-26 ENCOUNTER — Other Ambulatory Visit: Payer: Self-pay

## 2024-05-26 MED ORDER — LISDEXAMFETAMINE DIMESYLATE 40 MG PO CAPS: 40.0000 mg | ORAL_CAPSULE | ORAL | 0 refills | Status: DC

## 2024-06-09 ENCOUNTER — Ambulatory Visit: Admitting: Family

## 2024-06-15 ENCOUNTER — Other Ambulatory Visit: Payer: Self-pay | Admitting: Medical Genetics

## 2024-06-17 ENCOUNTER — Ambulatory Visit (INDEPENDENT_AMBULATORY_CARE_PROVIDER_SITE_OTHER): Admitting: Family

## 2024-06-17 ENCOUNTER — Encounter: Payer: Self-pay | Admitting: Family

## 2024-06-17 VITALS — BP 120/80 | HR 80 | Ht 62.0 in | Wt 213.8 lb

## 2024-06-17 DIAGNOSIS — E66813 Obesity, class 3: Secondary | ICD-10-CM | POA: Diagnosis not present

## 2024-06-17 DIAGNOSIS — Z3046 Encounter for surveillance of implantable subdermal contraceptive: Secondary | ICD-10-CM | POA: Diagnosis not present

## 2024-06-17 DIAGNOSIS — Z79899 Other long term (current) drug therapy: Secondary | ICD-10-CM

## 2024-06-17 DIAGNOSIS — F900 Attention-deficit hyperactivity disorder, predominantly inattentive type: Secondary | ICD-10-CM

## 2024-06-17 DIAGNOSIS — Z0130 Encounter for examination of blood pressure without abnormal findings: Secondary | ICD-10-CM

## 2024-06-17 MED ORDER — LISDEXAMFETAMINE DIMESYLATE 50 MG PO CAPS: 50.0000 mg | ORAL_CAPSULE | ORAL | 0 refills | Status: DC

## 2024-06-17 MED ORDER — WEGOVY 0.25 MG/0.5ML ~~LOC~~ SOAJ: 0.2500 mg | SUBCUTANEOUS | 0 refills | Status: AC

## 2024-06-17 NOTE — Progress Notes (Unsigned)
 Established Patient Office Visit  Subjective:  Patient ID: Julie Pierce, female    DOB: Sep 15, 2001  Age: 23 y.o. MRN: [id number]  Chief Complaint  Patient presents with   Follow-up    3 month follow up    Adjust vyvanse .  - UDS  Send RX's to [facility] pharmacy  Ref. To OB GYN for Nexplanon replacement.       No other concerns at this time.   Past Medical History:  Diagnosis Date   Obesity, Class III, BMI 40-49.9 (morbid obesity) 02/26/2023   Other fatigue 02/26/2023   Prediabetes 02/26/2023    Past Surgical History:  Procedure Laterality Date   TYMPANOSTOMY TUBE PLACEMENT  2008    Social History   Socioeconomic History   Marital status: Significant Other    Spouse name: Not on file   Number of children: Not on file   Years of education: Not on file   Highest education level: Not on file  Occupational History   Not on file  Tobacco Use   Smoking status: Never   Smokeless tobacco: Not on file  Vaping Use   Vaping status: Some Days  Substance and Sexual Activity   Alcohol use: Not Currently   Drug use: Never   Sexual activity: Yes    Birth control/protection: Implant  Other Topics Concern   Not on file  Social History Narrative   Not on file   Social Drivers of Health   Financial Resource Strain: Not on file  Food Insecurity: Not on file  Transportation Needs: Not on file  Physical Activity: Not on file  Stress: Not on file  Social Connections: Not on file  Intimate Partner Violence: Not on file    Family History  Problem Relation Age of Onset   Diabetes Mother    ADD / ADHD Father    Hypertension Father    Hyperlipidemia Father     No Known Allergies  Review of Systems  All other systems reviewed and are negative.      Objective:   BP 120/80   Pulse 80   Ht 5' 2 (1.575 m)   Wt 213 lb 12.8 oz (97 kg)   SpO2 97%   BMI 39.10 kg/m   Vitals:   06/17/24 0944  BP: 120/80  Pulse: 80  Height: 5' 2 (1.575 m)  Weight: 213 lb  12.8 oz (97 kg)  SpO2: 97%  BMI (Calculated): 39.09    Physical Exam Vitals and nursing note reviewed.  Constitutional:      Appearance: Normal appearance. She is normal weight.  HENT:     Head: Normocephalic.  Eyes:     Extraocular Movements: Extraocular movements intact.     Conjunctiva/sclera: Conjunctivae normal.     Pupils: Pupils are equal, round, and reactive to light.  Cardiovascular:     Rate and Rhythm: Normal rate.  Pulmonary:     Effort: Pulmonary effort is normal.  Neurological:     General: No focal deficit present.     Mental Status: She is alert and oriented to person, place, and time. Mental status is at baseline.  Psychiatric:        Mood and Affect: Mood normal.        Behavior: Behavior normal.        Thought Content: Thought content normal.      No results found for any visits on 06/17/24.  Recent Results (from the past 2160 hours)  Lipid panel     Status:  None   Collection Time: 03/23/24  7:49 AM  Result Value Ref Range   Cholesterol, Total 123 100 - 199 mg/dL   Triglycerides 89 0 - 149 mg/dL   HDL 40 >60 mg/dL   VLDL Cholesterol Cal 17 5 - 40 mg/dL   LDL Chol Calc (NIH) 66 0 - 99 mg/dL   Chol/HDL Ratio 3.1 0.0 - 4.4 ratio    Comment:                                   T. Chol/HDL Ratio                                             Men  Women                               1/2 Avg.Risk  3.4    3.3                                   Avg.Risk  5.0    4.4                                2X Avg.Risk  9.6    7.1                                3X Avg.Risk 23.4   11.0   VITAMIN D  25 Hydroxy (Vit-D Deficiency, Fractures)     Status: None   Collection Time: 03/23/24  7:49 AM  Result Value Ref Range   Vit D, 25-Hydroxy 69.6 30.0 - 100.0 ng/mL    Comment: Vitamin D  deficiency has been defined by the Institute of Medicine and an Endocrine Society practice guideline as a level of serum 25-OH vitamin D  less than 20 ng/mL (1,2). The Endocrine Society went on  to further define vitamin D  insufficiency as a level between 21 and 29 ng/mL (2). 1. IOM (Institute of Medicine). 2010. Dietary reference    intakes for calcium and D. [place]: The    [employer]. 2. Holick MF, Binkley [place], Bischoff-Ferrari HA, et al.    Evaluation, treatment, and prevention of vitamin D     deficiency: an Endocrine Society clinical practice    guideline. JCEM. 2011 Jul; 96(7):1911-30.   CMP14+EGFR     Status: None   Collection Time: 03/23/24  7:49 AM  Result Value Ref Range   Glucose 84 70 - 99 mg/dL   BUN 10 6 - 20 mg/dL   Creatinine, Ser 9.22 0.57 - 1.00 mg/dL   eGFR 888 >40 fO/fpw/8.26   BUN/Creatinine Ratio 13 9 - 23   Sodium 139 134 - 144 mmol/L   Potassium 4.1 3.5 - 5.2 mmol/L   Chloride 104 96 - 106 mmol/L   CO2 20 20 - 29 mmol/L   Calcium 9.1 8.7 - 10.2 mg/dL   Total Protein 6.7 6.0 - 8.5 g/dL   Albumin 4.1 4.0 - 5.0 g/dL   Globulin, Total 2.6 1.5 - 4.5 g/dL   Bilirubin Total 0.2 0.0 - 1.2 mg/dL  Alkaline Phosphatase 66 44 - 121 IU/L   AST 15 0 - 40 IU/L   ALT 25 0 - 32 IU/L  TSH     Status: None   Collection Time: 03/23/24  7:49 AM  Result Value Ref Range   TSH 1.690 0.450 - 4.500 uIU/mL  Hemoglobin A1c     Status: None   Collection Time: 03/23/24  7:49 AM  Result Value Ref Range   Hgb A1c MFr Bld 5.4 4.8 - 5.6 %    Comment:          Prediabetes: 5.7 - 6.4          Diabetes: >6.4          Glycemic control for adults with diabetes: <7.0    Est. average glucose Bld gHb Est-mCnc 108 mg/dL  Vitamin A87     Status: None   Collection Time: 03/23/24  7:49 AM  Result Value Ref Range   Vitamin B-12 603 232 - 1,245 pg/mL  CBC with Diff     Status: None   Collection Time: 03/23/24  7:49 AM  Result Value Ref Range   WBC 8.5 3.4 - 10.8 x10E3/uL   RBC 4.26 3.77 - 5.28 x10E6/uL   Hemoglobin 13.2 11.1 - 15.9 g/dL   Hematocrit 59.3 65.9 - 46.6 %   MCV 95 79 - 97 fL   MCH 31.0 26.6 - 33.0 pg   MCHC 32.5 31.5 - 35.7 g/dL   RDW 87.4  88.2 - 84.5 %   Platelets 199 150 - 450 x10E3/uL   Neutrophils 57 Not Estab. %   Lymphs 31 Not Estab. %   Monocytes 9 Not Estab. %   Eos 2 Not Estab. %   Basos 1 Not Estab. %   Neutrophils Absolute 4.9 1.4 - 7.0 x10E3/uL   Lymphocytes Absolute 2.6 0.7 - 3.1 x10E3/uL   Monocytes Absolute 0.8 0.1 - 0.9 x10E3/uL   EOS (ABSOLUTE) 0.1 0.0 - 0.4 x10E3/uL   Basophils Absolute 0.0 0.0 - 0.2 x10E3/uL   Immature Granulocytes 0 Not Estab. %   Immature Grans (Abs) 0.0 0.0 - 0.1 x10E3/uL       Assessment & Plan:   Assessment & Plan Encounter for surveillance of implantable subdermal contraceptive  Long term current use of therapeutic drug     Return in about 1 month (around 07/17/2024).   Total time spent: {AMA time spent:29001} minutes  ALAN CHRISTELLA ARRANT, FNP  06/17/2024   This document may have been prepared by [facility] Voice Recognition software and as such may include unintentional dictation errors.

## 2024-06-18 ENCOUNTER — Encounter: Payer: Self-pay | Admitting: Family

## 2024-06-18 NOTE — Assessment & Plan Note (Signed)
 UDS collected in office.  Will call with results when available.  Increasing Vyvanse  dose for pt.  Will recheck at follow up.

## 2024-06-18 NOTE — Assessment & Plan Note (Signed)
 Sending Wegovy  RX to pharmacy at [facility] for her.  Will reassess at follow up for effectiveness.

## 2024-06-20 LAB — TOXASSURE SELECT 13 (MW), URINE

## 2024-06-23 ENCOUNTER — Other Ambulatory Visit
Admission: RE | Admit: 2024-06-23 | Discharge: 2024-06-23 | Disposition: A | Discharge status: Home / Self Care | Payer: Self-pay | Source: Ambulatory Visit | Attending: Medical Genetics | Admitting: Medical Genetics

## 2024-06-30 LAB — GENECONNECT MOLECULAR SCREEN: Genetic Analysis Overall Interpretation: NEGATIVE

## 2024-07-02 ENCOUNTER — Ambulatory Visit: Payer: Self-pay

## 2024-07-17 ENCOUNTER — Ambulatory Visit: Admitting: Family

## 2024-07-31 ENCOUNTER — Other Ambulatory Visit: Payer: Self-pay | Admitting: Family

## 2024-09-03 ENCOUNTER — Other Ambulatory Visit: Payer: Self-pay | Admitting: Family

## 2024-09-09 ENCOUNTER — Other Ambulatory Visit: Payer: Self-pay

## 2024-09-16 ENCOUNTER — Ambulatory Visit: Admitting: Family

## 2024-09-16 ENCOUNTER — Encounter: Payer: Self-pay | Admitting: Family

## 2024-09-16 VITALS — BP 132/100 | HR 78 | Ht 62.0 in | Wt 214.8 lb

## 2024-09-16 DIAGNOSIS — Z0130 Encounter for examination of blood pressure without abnormal findings: Secondary | ICD-10-CM

## 2024-09-16 DIAGNOSIS — R5383 Other fatigue: Secondary | ICD-10-CM

## 2024-09-16 DIAGNOSIS — R7303 Prediabetes: Secondary | ICD-10-CM

## 2024-09-16 DIAGNOSIS — E66813 Obesity, class 3: Secondary | ICD-10-CM

## 2024-09-16 DIAGNOSIS — F900 Attention-deficit hyperactivity disorder, predominantly inattentive type: Secondary | ICD-10-CM | POA: Diagnosis not present

## 2024-09-20 MED ORDER — VITAMIN D (ERGOCALCIFEROL) 1.25 MG (50000 UNIT) PO CAPS: 50000.0000 [IU] | ORAL_CAPSULE | ORAL | 1 refills | Status: AC

## 2024-10-08 ENCOUNTER — Emergency Department
Admission: EM | Admit: 2024-10-08 | Discharge: 2024-10-08 | Disposition: A | Discharge status: Home / Self Care | Attending: Emergency Medicine | Admitting: Emergency Medicine

## 2024-10-08 ENCOUNTER — Other Ambulatory Visit: Payer: Self-pay

## 2024-10-08 DIAGNOSIS — S6991XA Unspecified injury of right wrist, hand and finger(s), initial encounter: Secondary | ICD-10-CM | POA: Diagnosis present

## 2024-10-08 DIAGNOSIS — W228XXA Striking against or struck by other objects, initial encounter: Secondary | ICD-10-CM | POA: Diagnosis not present

## 2024-10-08 NOTE — ED Provider Notes (Signed)
 "  [facility] Provider Note    Event Date/Time   First MD Initiated Contact with Patient 10/08/24 0815     (approximate)   History   Finger Injury   HPI  Julie Pierce is a 23 y.o. female with PMH of prediabetes and ADHD who presents for evaluation of a right index finger nail injury.  Patient states that she hit her hand on a wall and that it pulled her nail out of the nailbed.      Physical Exam   Triage Vital Signs: ED Triage Vitals [10/08/24 0812]  Encounter Vitals Group     BP (!) 138/90     Girls Systolic BP Percentile      Girls Diastolic BP Percentile      Boys Systolic BP Percentile      Boys Diastolic BP Percentile      Pulse Rate 63     Resp 18     Temp 98 F (36.7 C)     Temp src      SpO2 99 %     Weight 210 lb (95.3 kg)     Height 5' 2 (1.575 m)     Head Circumference      Peak Flow      Pain Score 8     Pain Loc      Pain Education      Exclude from Growth Chart     Most recent vital signs: Vitals:   10/08/24 0812  BP: (!) 138/90  Pulse: 63  Resp: 18  Temp: 98 F (36.7 C)  SpO2: 99%   General: Awake, no distress. CV:  Good peripheral perfusion. Resp:  Normal effort.  Abd:  No distention.  Other:  Ulnar side of right index nail root is sticking out from the cuticle, nail is coming away from the nailbed but radial side of the nail root is still intact, small amount of dried blood surrounding the nailbed   ED Results / Procedures / Treatments   Labs (all labs ordered are listed, but only abnormal results are displayed) Labs Reviewed - No data to display   PROCEDURES:  Critical Care performed: No  Procedures   MEDICATIONS ORDERED IN ED: Medications - No data to display   IMPRESSION / MDM / ASSESSMENT AND PLAN / ED COURSE  I reviewed the triage vital signs and the nursing notes.                             23 year old female presents for a right fingernail injury, vital signs stable other than  slightly elevated blood pressure.  Patient NAD on exam.  Differential diagnosis includes, but is not limited to, nailbed injury, paronychia, subungual hematoma.  Patient's presentation is most consistent with acute, uncomplicated illness.  I was able to tuck the ulnar side of the nail root back underneath the skin.  I recommended that patient either remove her false nail tips using nail polish remover or trim them down as close to her original nail length to prevent it catching on anything further.  Considered fully removing the nail as it was separated away from the nailbed but felt that since majority of the nail root was still intact this would cause more damage than benefit.  Recommended that patient tape her nail and using a Band-Aid to keep it from pulling away and to secure it until the nail grows out fully.  Patient was  also given a finger splint for additional protection.  She voiced understanding, all questions were answered and she was stable at discharge.      FINAL CLINICAL IMPRESSION(S) / ED DIAGNOSES   Final diagnoses:  Injury to fingernail of right hand, initial encounter     Rx / DC Orders   ED Discharge Orders     None        Note:  This document was prepared using Dragon voice recognition software and may include unintentional dictation errors.   Cleaster Tinnie LABOR, PA-C 10/08/24 9161    Arlander [place], MD 10/08/24 [phone]  "

## 2024-10-08 NOTE — Discharge Instructions (Signed)
 When you get home please remove the nail tip or trim it down as close to your original nail length as possible.  This is to help ensure it does not catch on anything else.  I also recommend using a Band-Aid or tape to secure your nail to your finger.  You can wear the splint if you find that to be comfortable but if it is more of an annoyance you can take it off.  Your nail will grow out fully and should heal but this will take several months.

## 2024-10-08 NOTE — ED Triage Notes (Signed)
 Pt to ED for right index fingernail injury, reports hit on wall and nail is coming off nail bed.

## 2024-10-11 ENCOUNTER — Encounter: Payer: Self-pay | Admitting: Family

## 2024-10-11 NOTE — Assessment & Plan Note (Signed)
 Patient stable.  Well controlled with current therapy.   Continue current meds.

## 2024-10-11 NOTE — Assessment & Plan Note (Signed)
 Continue current meds.  Will adjust as needed based on results.  The patient is asked to make an attempt to improve diet and exercise patterns to aid in medical management of this problem. Addressed importance of increasing and maintaining water  intake.

## 2024-10-11 NOTE — Assessment & Plan Note (Signed)
.  A1C Continues to be in prediabetic ranges.  Will reassess at follow up after next lab check.  Patient counseled on dietary choices and verbalized understanding.   -CBC w/Diff -CMP w/eGFR -Hemoglobin A1C

## 2024-10-11 NOTE — Progress Notes (Signed)
 "  Established Patient Office Visit  Subjective:  Patient ID: Julie Pierce, female    DOB: Jun 23, 2001  Age: 23 y.o. MRN: [id number]  Chief Complaint  Patient presents with   Follow-up    Follow up on medication dosage     Patient is here today for her 3 months follow up.  She has been feeling fairly well since last appointment.   She does not have additional concerns to discuss today.  She needs refills.   I have reviewed her active problem list, medication list, allergies, health maintenance, notes from last encounter, lab results for her appointment today.      No other concerns at this time.   Past Medical History:  Diagnosis Date   Obesity, Class III, BMI 40-49.9 (morbid obesity) (HCC) 02/26/2023   Other fatigue 02/26/2023   Prediabetes 02/26/2023    Past Surgical History:  Procedure Laterality Date   TYMPANOSTOMY TUBE PLACEMENT  2008    Social History   Socioeconomic History   Marital status: Significant Other    Spouse name: Not on file   Number of children: Not on file   Years of education: Not on file   Highest education level: Not on file  Occupational History   Not on file  Tobacco Use   Smoking status: Never   Smokeless tobacco: Not on file  Vaping Use   Vaping status: Some Days  Substance and Sexual Activity   Alcohol use: Not Currently   Drug use: Never   Sexual activity: Yes    Birth control/protection: Implant  Other Topics Concern   Not on file  Social History Narrative   Not on file   Social Drivers of Health   Tobacco Use: Unknown (10/08/2024)   Patient History    Smoking Tobacco Use: Never    Smokeless Tobacco Use: Unknown    Passive Exposure: Not on file  Financial Resource Strain: Not on file  Food Insecurity: Not on file  Transportation Needs: Not on file  Physical Activity: Not on file  Stress: Not on file  Social Connections: Not on file  Intimate Partner Violence: Not on file  Depression (PHQ2-9): Low Risk  (12/12/2023)   Depression (PHQ2-9)    PHQ-2 Score: 2  Alcohol Screen: Not on file  Housing: Not on file  Utilities: Not on file  Health Literacy: Not on file    Family History  Problem Relation Age of Onset   Diabetes Mother    ADD / ADHD Father    Hypertension Father    Hyperlipidemia Father     Allergies[1]  Review of Systems  All other systems reviewed and are negative.      Objective:   BP (!) 132/100   Pulse 78   Ht 5' 2 (1.575 m)   Wt 214 lb 12.8 oz (97.4 kg)   LMP 09/03/2024 (Approximate)   SpO2 98%   BMI 39.29 kg/m   Vitals:   09/16/24 1129  BP: (!) 132/100  Pulse: 78  Height: 5' 2 (1.575 m)  Weight: 214 lb 12.8 oz (97.4 kg)  SpO2: 98%  BMI (Calculated): 39.28    Physical Exam Vitals and nursing note reviewed.  Constitutional:      Appearance: Normal appearance. She is normal weight.  HENT:     Head: Normocephalic.  Eyes:     Extraocular Movements: Extraocular movements intact.     Conjunctiva/sclera: Conjunctivae normal.     Pupils: Pupils are equal, round, and reactive to light.  Cardiovascular:  Rate and Rhythm: Normal rate.  Pulmonary:     Effort: Pulmonary effort is normal.  Neurological:     General: No focal deficit present.     Mental Status: She is alert and oriented to person, place, and time. Mental status is at baseline.  Psychiatric:        Mood and Affect: Mood normal.        Behavior: Behavior normal.        Thought Content: Thought content normal.      No results found for any visits on 09/16/24.  No results found for this or any previous visit (from the past 2160 hours).     Assessment & Plan ADHD (attention deficit hyperactivity disorder), inattentive type Patient stable.  Well controlled with current therapy.   Continue current meds.   Obesity, Class III, BMI 40-49.9 (morbid obesity) (HCC) Continue current meds.  Will adjust as needed based on results.  The patient is asked to make an attempt to  improve diet and exercise patterns to aid in medical management of this problem. Addressed importance of increasing and maintaining water intake.   Other fatigue Patient stable.  Well controlled with current therapy.   Continue current meds.   Prediabetes A1C Continues to be in prediabetic ranges.  Will reassess at follow up after next lab check.  Patient counseled on dietary choices and verbalized understanding.   -CBC w/Diff -CMP w/eGFR -Hemoglobin A1C     Return in about 3 months (around 12/15/2024).   Total time spent: 20 minutes  ALAN CHRISTELLA ARRANT, FNP  09/16/2024   This document may have been prepared by [facility] Voice Recognition software and as such may include unintentional dictation errors.     [1] No Known Allergies  "

## 2024-10-17 ENCOUNTER — Other Ambulatory Visit: Payer: Self-pay | Admitting: Family

## 2024-11-16 ENCOUNTER — Other Ambulatory Visit: Payer: Self-pay | Admitting: Family

## 2024-12-16 ENCOUNTER — Ambulatory Visit: Admitting: Family
